# Patient Record
Sex: Female | Born: 1935 | Race: White | Hispanic: No | Marital: Married | Smoking: Never smoker
Health system: Southern US, Community
[De-identification: ages and names within clinical notes are randomized; demographics above are authoritative.]

## PROBLEM LIST (undated history)

## (undated) DIAGNOSIS — C50919 Malignant neoplasm of unspecified site of unspecified female breast: Secondary | ICD-10-CM

## (undated) DIAGNOSIS — C801 Malignant (primary) neoplasm, unspecified: Secondary | ICD-10-CM

## (undated) DIAGNOSIS — E119 Type 2 diabetes mellitus without complications: Secondary | ICD-10-CM

## (undated) HISTORY — PX: BREAST EXCISIONAL BIOPSY: SUR124

## (undated) HISTORY — PX: BREAST BIOPSY: SHX20

---

## 1991-12-03 DIAGNOSIS — C50919 Malignant neoplasm of unspecified site of unspecified female breast: Secondary | ICD-10-CM

## 1991-12-03 HISTORY — DX: Malignant neoplasm of unspecified site of unspecified female breast: C50.919

## 1991-12-03 HISTORY — PX: MASTECTOMY: SHX3

## 2005-05-29 ENCOUNTER — Ambulatory Visit: Payer: Self-pay | Admitting: General Surgery

## 2005-06-18 ENCOUNTER — Ambulatory Visit: Payer: Self-pay | Admitting: Internal Medicine

## 2005-07-02 ENCOUNTER — Ambulatory Visit: Payer: Self-pay | Admitting: Internal Medicine

## 2005-08-02 ENCOUNTER — Ambulatory Visit: Payer: Self-pay | Admitting: Internal Medicine

## 2005-09-01 ENCOUNTER — Ambulatory Visit: Payer: Self-pay | Admitting: Internal Medicine

## 2006-03-10 ENCOUNTER — Ambulatory Visit: Payer: Self-pay | Admitting: Internal Medicine

## 2006-03-18 ENCOUNTER — Ambulatory Visit: Payer: Self-pay | Admitting: Internal Medicine

## 2006-06-02 ENCOUNTER — Ambulatory Visit: Payer: Self-pay | Admitting: General Surgery

## 2006-08-08 ENCOUNTER — Ambulatory Visit: Payer: Self-pay | Admitting: Unknown Physician Specialty

## 2006-10-11 ENCOUNTER — Emergency Department: Payer: Self-pay | Admitting: Emergency Medicine

## 2006-10-11 ENCOUNTER — Other Ambulatory Visit: Payer: Self-pay

## 2007-06-10 ENCOUNTER — Ambulatory Visit: Payer: Self-pay | Admitting: General Surgery

## 2008-05-06 ENCOUNTER — Ambulatory Visit: Payer: Self-pay | Admitting: Internal Medicine

## 2008-06-01 ENCOUNTER — Ambulatory Visit: Payer: Self-pay | Admitting: General Surgery

## 2008-08-02 ENCOUNTER — Other Ambulatory Visit: Payer: Self-pay

## 2008-08-02 ENCOUNTER — Emergency Department: Payer: Self-pay | Admitting: Emergency Medicine

## 2008-08-03 ENCOUNTER — Ambulatory Visit: Payer: Self-pay

## 2009-06-07 ENCOUNTER — Ambulatory Visit: Payer: Self-pay | Admitting: General Surgery

## 2009-06-20 ENCOUNTER — Ambulatory Visit: Payer: Self-pay | Admitting: General Surgery

## 2009-06-22 ENCOUNTER — Ambulatory Visit: Payer: Self-pay | Admitting: General Surgery

## 2010-02-13 ENCOUNTER — Ambulatory Visit: Payer: Self-pay | Admitting: Unknown Physician Specialty

## 2010-04-23 ENCOUNTER — Ambulatory Visit: Payer: Self-pay | Admitting: Unknown Physician Specialty

## 2010-04-26 ENCOUNTER — Ambulatory Visit: Payer: Self-pay | Admitting: Unknown Physician Specialty

## 2010-07-23 ENCOUNTER — Ambulatory Visit: Payer: Self-pay | Admitting: General Surgery

## 2011-09-02 ENCOUNTER — Ambulatory Visit: Payer: Self-pay | Admitting: General Surgery

## 2012-02-11 ENCOUNTER — Emergency Department: Payer: Self-pay | Admitting: Unknown Physician Specialty

## 2012-09-11 ENCOUNTER — Ambulatory Visit: Payer: Self-pay | Admitting: General Surgery

## 2013-06-21 ENCOUNTER — Ambulatory Visit: Payer: Self-pay | Admitting: Physical Medicine and Rehabilitation

## 2013-09-14 ENCOUNTER — Ambulatory Visit: Payer: Self-pay | Admitting: Family Medicine

## 2014-09-15 ENCOUNTER — Ambulatory Visit: Payer: Self-pay | Admitting: Internal Medicine

## 2014-09-22 DIAGNOSIS — Z853 Personal history of malignant neoplasm of breast: Secondary | ICD-10-CM | POA: Insufficient documentation

## 2014-11-14 ENCOUNTER — Emergency Department: Payer: Self-pay | Admitting: Emergency Medicine

## 2015-01-10 ENCOUNTER — Ambulatory Visit: Payer: Self-pay | Admitting: Internal Medicine

## 2015-05-03 ENCOUNTER — Other Ambulatory Visit: Payer: Self-pay | Admitting: Internal Medicine

## 2015-05-03 DIAGNOSIS — R1031 Right lower quadrant pain: Secondary | ICD-10-CM

## 2015-05-09 ENCOUNTER — Ambulatory Visit
Admission: RE | Admit: 2015-05-09 | Discharge: 2015-05-09 | Disposition: A | Discharge status: Home / Self Care | Payer: Medicare Other | Source: Ambulatory Visit | Attending: Internal Medicine | Admitting: Internal Medicine

## 2015-05-09 DIAGNOSIS — R1031 Right lower quadrant pain: Secondary | ICD-10-CM

## 2015-05-09 HISTORY — DX: Type 2 diabetes mellitus without complications: E11.9

## 2015-05-09 HISTORY — DX: Malignant (primary) neoplasm, unspecified: C80.1

## 2015-05-09 MED ORDER — IOHEXOL 300 MG/ML  SOLN
100.0000 mL | Freq: Once | INTRAMUSCULAR | Status: AC | PRN
  Administered 2015-05-09: 100 mL via INTRAVENOUS

## 2016-03-29 ENCOUNTER — Other Ambulatory Visit: Payer: Self-pay | Admitting: Internal Medicine

## 2016-03-29 DIAGNOSIS — Z1231 Encounter for screening mammogram for malignant neoplasm of breast: Secondary | ICD-10-CM

## 2016-04-03 ENCOUNTER — Ambulatory Visit
Admission: RE | Admit: 2016-04-03 | Discharge: 2016-04-03 | Disposition: A | Discharge status: Home / Self Care | Payer: Medicare Other | Source: Ambulatory Visit | Attending: Internal Medicine | Admitting: Internal Medicine

## 2016-04-03 ENCOUNTER — Other Ambulatory Visit: Payer: Self-pay | Admitting: Internal Medicine

## 2016-04-03 DIAGNOSIS — Z1231 Encounter for screening mammogram for malignant neoplasm of breast: Secondary | ICD-10-CM

## 2017-02-21 ENCOUNTER — Other Ambulatory Visit: Payer: Self-pay | Admitting: Internal Medicine

## 2017-02-21 DIAGNOSIS — Z1231 Encounter for screening mammogram for malignant neoplasm of breast: Secondary | ICD-10-CM

## 2017-04-08 ENCOUNTER — Ambulatory Visit
Admission: RE | Admit: 2017-04-08 | Discharge: 2017-04-08 | Disposition: A | Discharge status: Home / Self Care | Payer: Medicare Other | Source: Ambulatory Visit | Attending: Internal Medicine | Admitting: Internal Medicine

## 2017-04-08 DIAGNOSIS — Z1231 Encounter for screening mammogram for malignant neoplasm of breast: Secondary | ICD-10-CM | POA: Diagnosis present

## 2017-04-08 HISTORY — DX: Malignant neoplasm of unspecified site of unspecified female breast: C50.919

## 2018-03-06 ENCOUNTER — Other Ambulatory Visit: Payer: Self-pay | Admitting: Internal Medicine

## 2018-03-06 DIAGNOSIS — Z1231 Encounter for screening mammogram for malignant neoplasm of breast: Secondary | ICD-10-CM

## 2018-04-10 ENCOUNTER — Ambulatory Visit
Admission: RE | Admit: 2018-04-10 | Discharge: 2018-04-10 | Disposition: A | Discharge status: Home / Self Care | Payer: Medicare Other | Source: Ambulatory Visit | Attending: Internal Medicine | Admitting: Internal Medicine

## 2018-04-10 DIAGNOSIS — Z1231 Encounter for screening mammogram for malignant neoplasm of breast: Secondary | ICD-10-CM | POA: Insufficient documentation

## 2019-01-04 ENCOUNTER — Ambulatory Visit
Admission: RE | Admit: 2019-01-04 | Discharge: 2019-01-04 | Disposition: A | Discharge status: Home / Self Care | Payer: Medicare Other | Source: Ambulatory Visit | Attending: Internal Medicine | Admitting: Internal Medicine

## 2019-01-04 ENCOUNTER — Other Ambulatory Visit: Payer: Self-pay | Admitting: Internal Medicine

## 2019-01-04 DIAGNOSIS — N320 Bladder-neck obstruction: Secondary | ICD-10-CM | POA: Diagnosis not present

## 2019-01-04 DIAGNOSIS — N3090 Cystitis, unspecified without hematuria: Secondary | ICD-10-CM | POA: Diagnosis present

## 2019-01-04 DIAGNOSIS — N289 Disorder of kidney and ureter, unspecified: Secondary | ICD-10-CM | POA: Insufficient documentation

## 2019-01-04 DIAGNOSIS — N281 Cyst of kidney, acquired: Secondary | ICD-10-CM | POA: Insufficient documentation

## 2019-01-05 ENCOUNTER — Other Ambulatory Visit: Payer: Self-pay | Admitting: Internal Medicine

## 2019-01-05 DIAGNOSIS — N2889 Other specified disorders of kidney and ureter: Secondary | ICD-10-CM

## 2019-01-14 ENCOUNTER — Ambulatory Visit
Admission: RE | Admit: 2019-01-14 | Discharge: 2019-01-14 | Disposition: A | Discharge status: Home / Self Care | Payer: Medicare Other | Source: Ambulatory Visit | Attending: Internal Medicine | Admitting: Internal Medicine

## 2019-01-14 DIAGNOSIS — N2889 Other specified disorders of kidney and ureter: Secondary | ICD-10-CM | POA: Insufficient documentation

## 2019-01-14 MED ORDER — GADOBUTROL 1 MMOL/ML IV SOLN
7.0000 mL | Freq: Once | INTRAVENOUS | Status: AC | PRN
  Administered 2019-01-14: 7 mL via INTRAVENOUS

## 2019-05-19 ENCOUNTER — Other Ambulatory Visit: Payer: Self-pay | Admitting: Internal Medicine

## 2019-05-19 DIAGNOSIS — Z1231 Encounter for screening mammogram for malignant neoplasm of breast: Secondary | ICD-10-CM

## 2019-08-02 ENCOUNTER — Ambulatory Visit
Admission: RE | Admit: 2019-08-02 | Discharge: 2019-08-02 | Disposition: A | Discharge status: Home / Self Care | Payer: Medicare Other | Source: Ambulatory Visit | Attending: Internal Medicine | Admitting: Internal Medicine

## 2019-08-02 DIAGNOSIS — Z1231 Encounter for screening mammogram for malignant neoplasm of breast: Secondary | ICD-10-CM

## 2019-12-26 DIAGNOSIS — Z20822 Contact with and (suspected) exposure to covid-19: Secondary | ICD-10-CM | POA: Diagnosis not present

## 2020-01-03 DIAGNOSIS — E1169 Type 2 diabetes mellitus with other specified complication: Secondary | ICD-10-CM | POA: Diagnosis not present

## 2020-01-03 DIAGNOSIS — I10 Essential (primary) hypertension: Secondary | ICD-10-CM | POA: Diagnosis not present

## 2020-01-03 DIAGNOSIS — E538 Deficiency of other specified B group vitamins: Secondary | ICD-10-CM | POA: Diagnosis not present

## 2020-01-03 DIAGNOSIS — E782 Mixed hyperlipidemia: Secondary | ICD-10-CM | POA: Diagnosis not present

## 2020-01-05 DIAGNOSIS — E113293 Type 2 diabetes mellitus with mild nonproliferative diabetic retinopathy without macular edema, bilateral: Secondary | ICD-10-CM | POA: Diagnosis not present

## 2020-01-05 DIAGNOSIS — Z794 Long term (current) use of insulin: Secondary | ICD-10-CM | POA: Diagnosis not present

## 2020-01-05 DIAGNOSIS — H353231 Exudative age-related macular degeneration, bilateral, with active choroidal neovascularization: Secondary | ICD-10-CM | POA: Diagnosis not present

## 2020-01-05 DIAGNOSIS — H35373 Puckering of macula, bilateral: Secondary | ICD-10-CM | POA: Diagnosis not present

## 2020-01-05 DIAGNOSIS — H3540 Unspecified peripheral retinal degeneration: Secondary | ICD-10-CM | POA: Diagnosis not present

## 2020-01-13 DIAGNOSIS — H401411 Capsular glaucoma with pseudoexfoliation of lens, right eye, mild stage: Secondary | ICD-10-CM | POA: Diagnosis not present

## 2020-01-13 DIAGNOSIS — H401423 Capsular glaucoma with pseudoexfoliation of lens, left eye, severe stage: Secondary | ICD-10-CM | POA: Diagnosis not present

## 2020-01-29 IMAGING — MG MM DIGITAL SCREENING UNILAT*R* W/ TOMO W/ CAD
4 series · 4 of 12 positions shown · non-contrast
Comparison: Previous exam(s).

CLINICAL DATA: Screening.

EXAM:
DIGITAL SCREENING UNILATERAL RIGHT MAMMOGRAM WITH CAD AND TOMO

[R CC synth-2D]
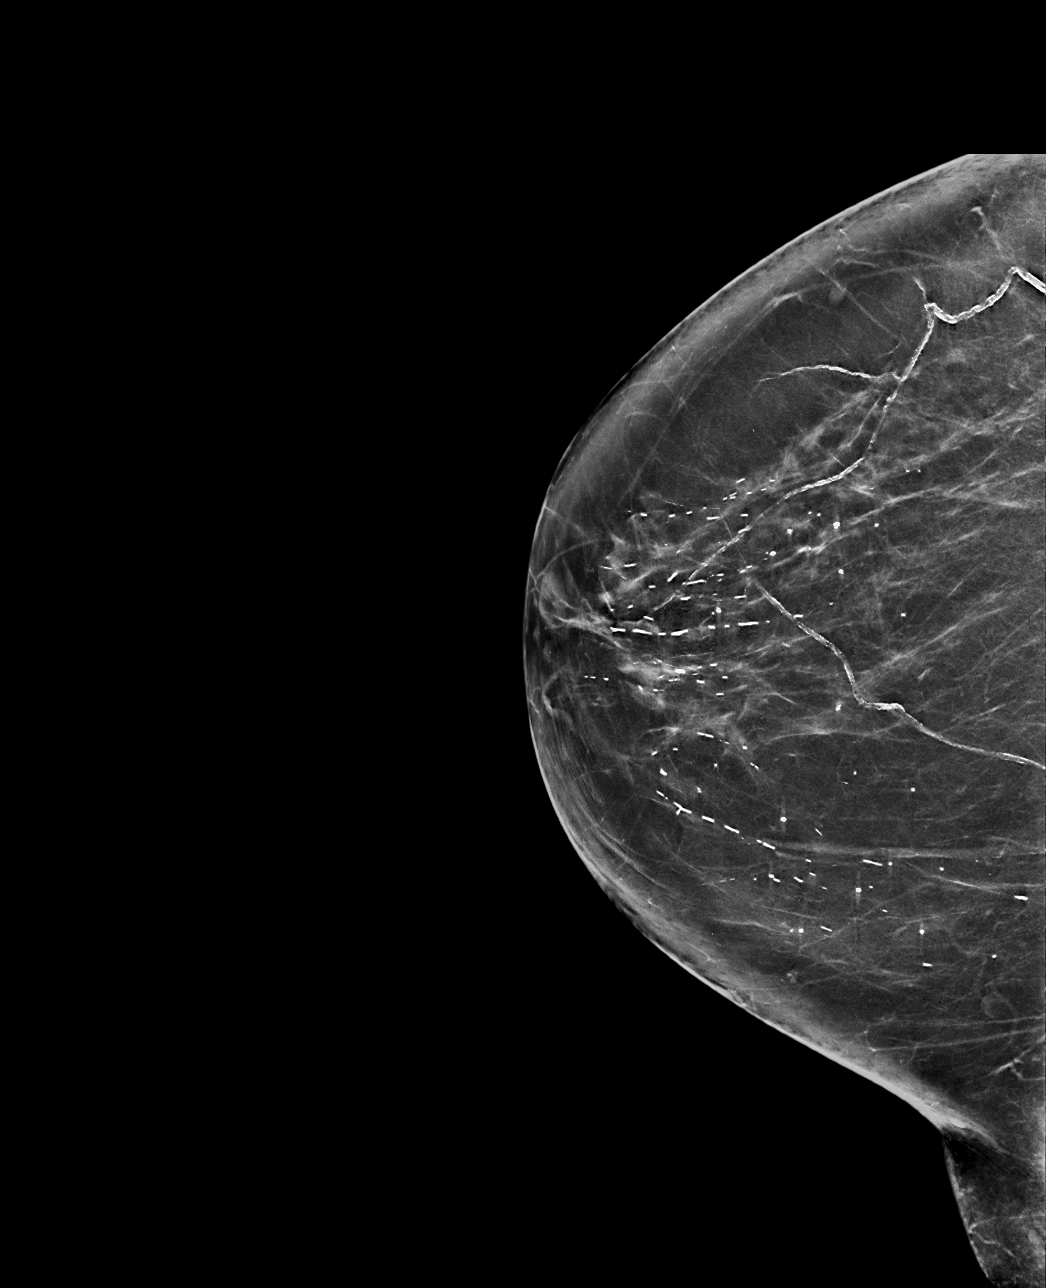

[R MLO synth-2D]
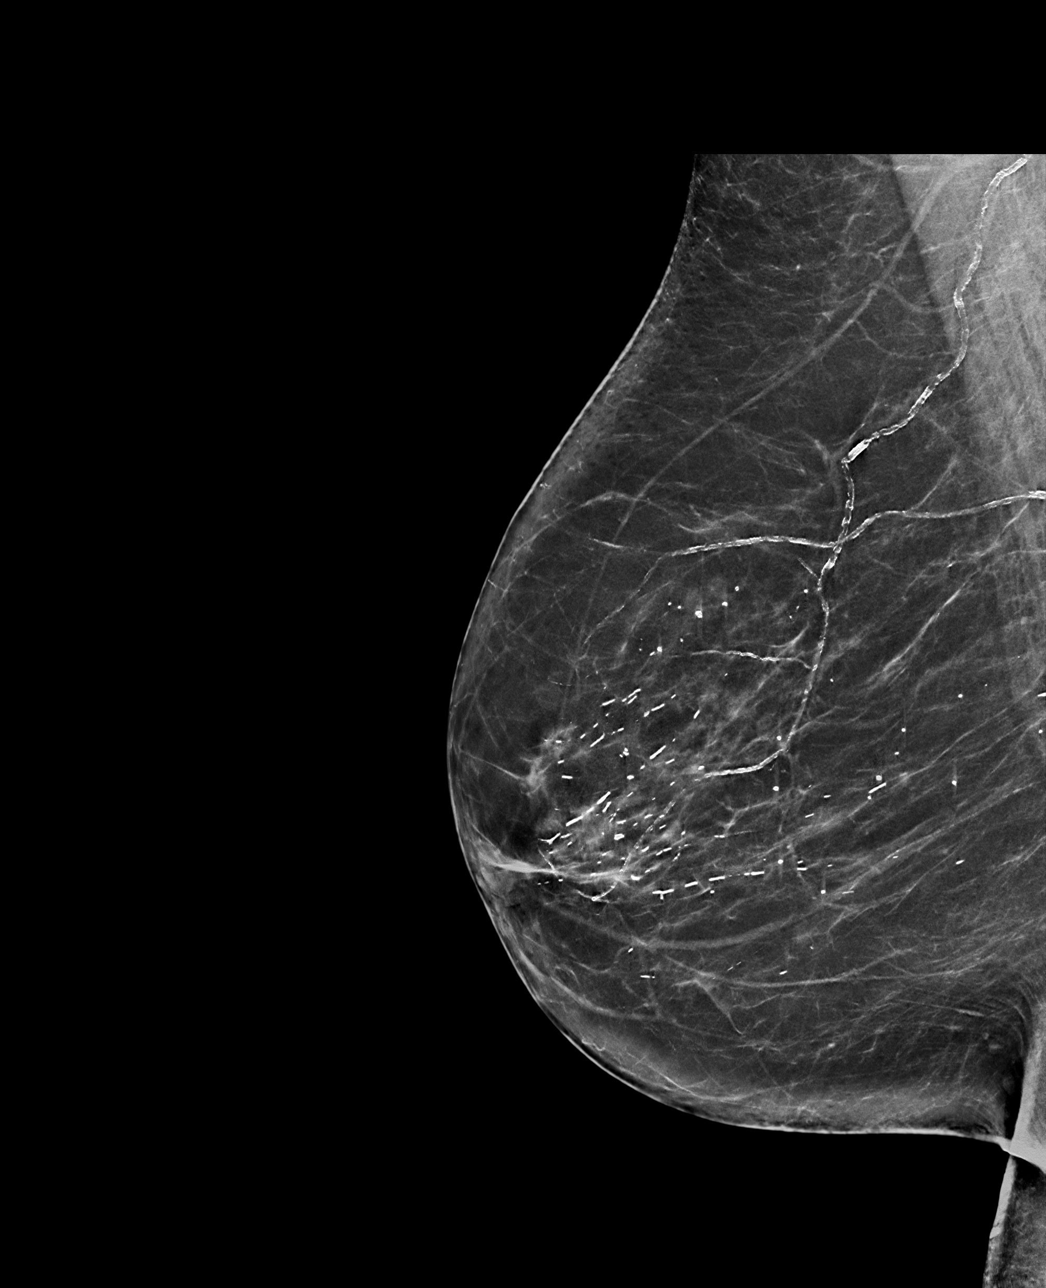

[R MLO tomo · tomo slice 38/75.0]
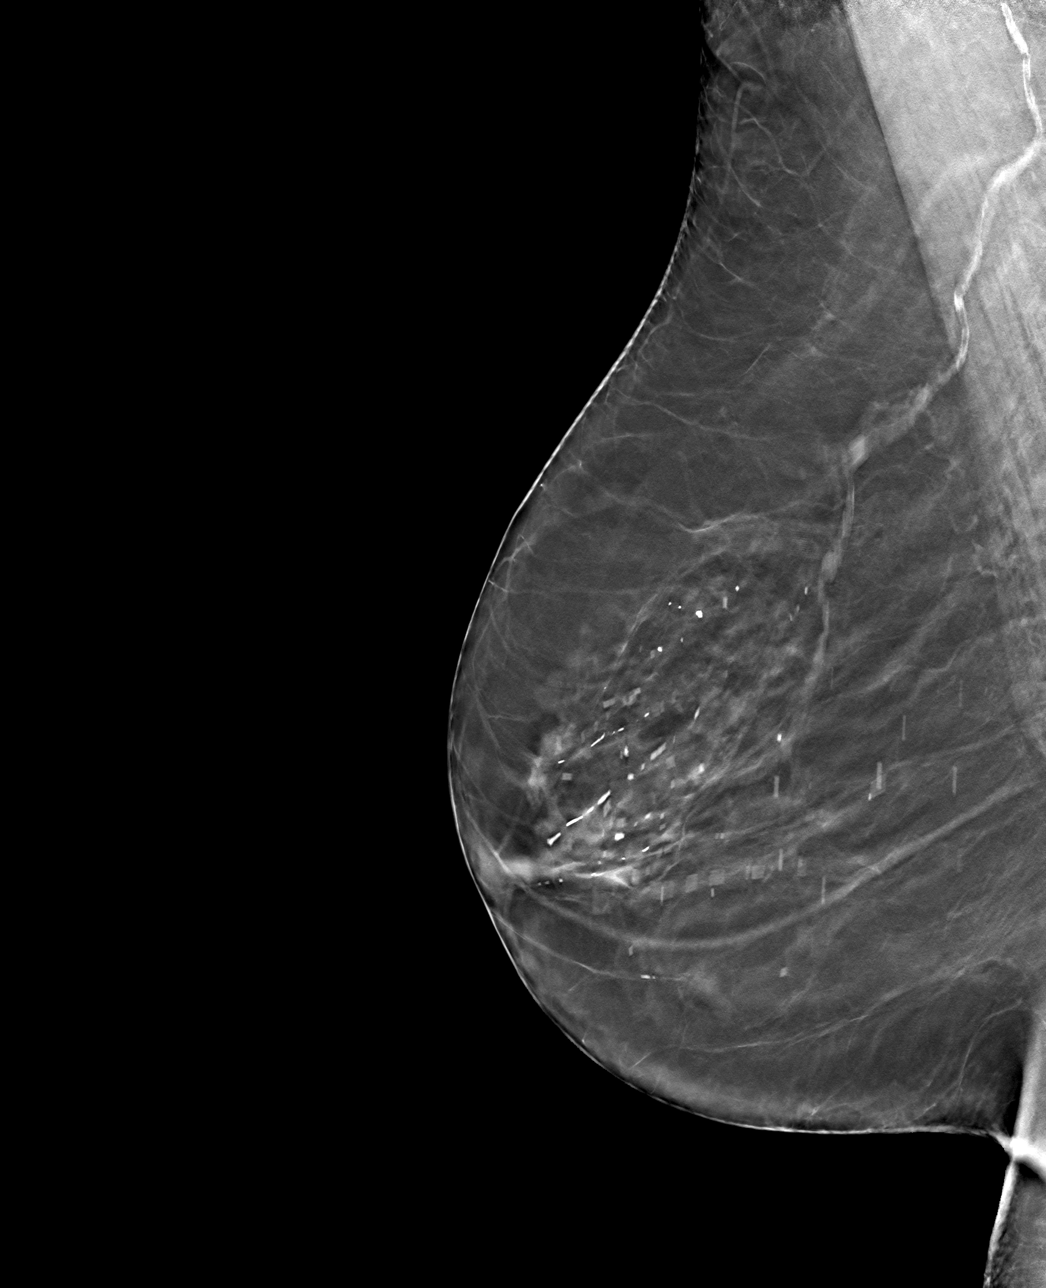

[R CC tomo · tomo slice 38/75.0]
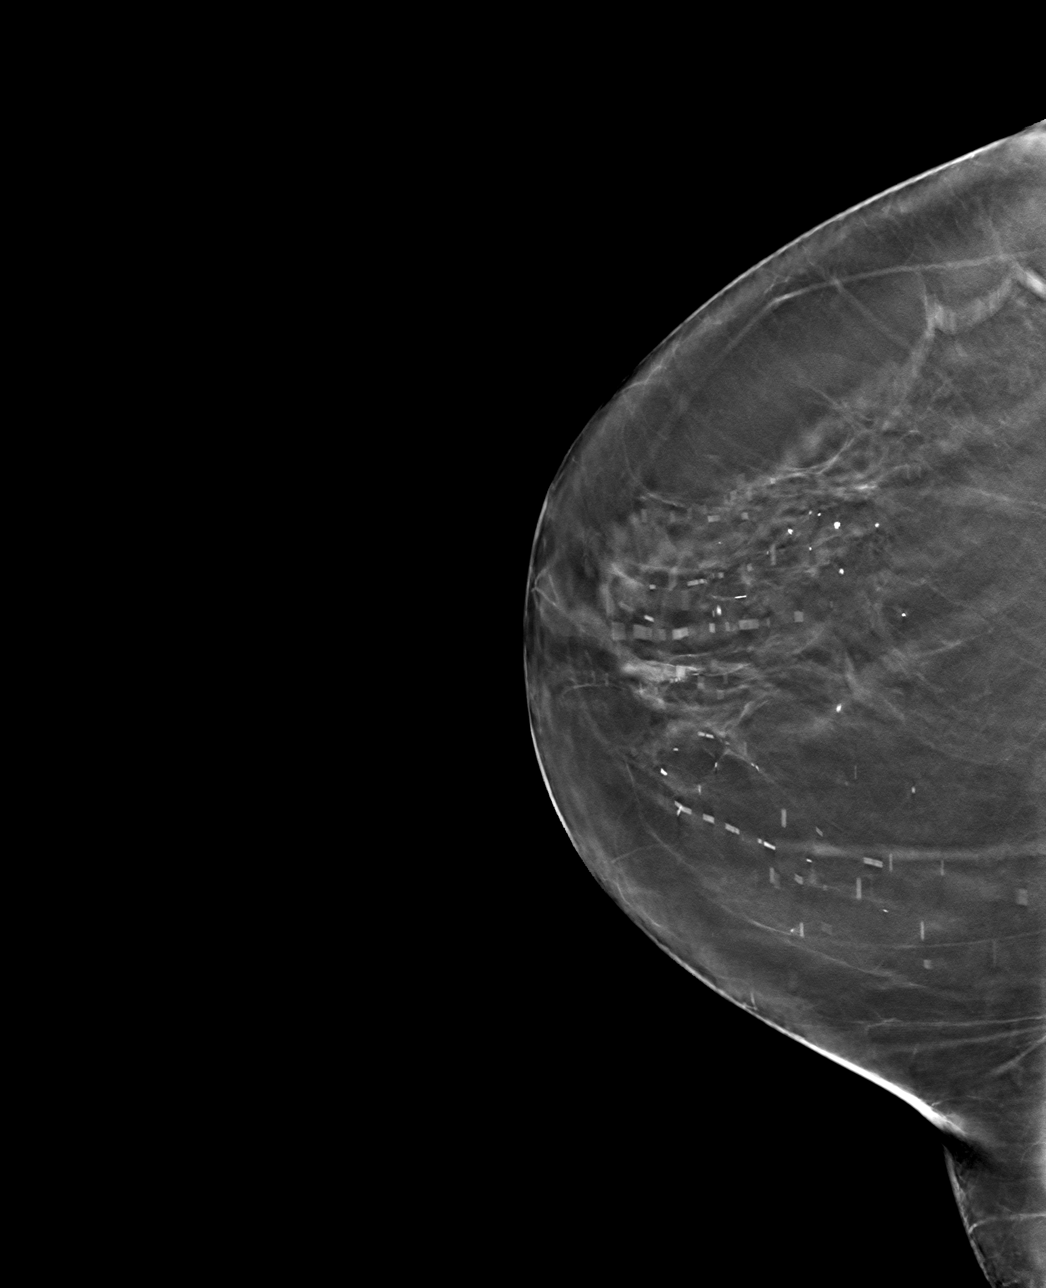

[4 of 12 positions shown; findings below may reference images not displayed]

ACR Breast Density Category b: There are scattered areas of
fibroglandular density.
FINDINGS: The patient has had a left mastectomy. There are no findings
suspicious for malignancy. Images were processed with CAD.
IMPRESSION: No mammographic evidence of malignancy. A result letter of this
screening mammogram will be mailed directly to the patient.

RECOMMENDATION:
Screening mammogram in one year.  (Code:H3-9-9K3)

BI-RADS CATEGORY  1: Negative.

## 2020-06-23 ENCOUNTER — Other Ambulatory Visit: Payer: Self-pay | Admitting: Internal Medicine

## 2020-06-23 DIAGNOSIS — Z1231 Encounter for screening mammogram for malignant neoplasm of breast: Secondary | ICD-10-CM

## 2020-08-04 ENCOUNTER — Other Ambulatory Visit: Payer: Self-pay

## 2020-08-04 ENCOUNTER — Ambulatory Visit
Admission: RE | Admit: 2020-08-04 | Discharge: 2020-08-04 | Disposition: A | Discharge status: Home / Self Care | Payer: Medicare HMO | Source: Ambulatory Visit | Attending: Internal Medicine | Admitting: Internal Medicine

## 2020-08-04 ENCOUNTER — Other Ambulatory Visit: Payer: Self-pay | Admitting: Internal Medicine

## 2020-08-04 DIAGNOSIS — Z1231 Encounter for screening mammogram for malignant neoplasm of breast: Secondary | ICD-10-CM

## 2020-12-01 ENCOUNTER — Emergency Department: Payer: Medicare HMO

## 2020-12-01 ENCOUNTER — Other Ambulatory Visit: Payer: Self-pay

## 2020-12-01 ENCOUNTER — Emergency Department
Admission: EM | Admit: 2020-12-01 | Discharge: 2020-12-01 | Disposition: A | Discharge status: Home / Self Care | Payer: Medicare HMO | Attending: Emergency Medicine | Admitting: Emergency Medicine

## 2020-12-01 ENCOUNTER — Encounter: Payer: Self-pay | Admitting: Emergency Medicine

## 2020-12-01 DIAGNOSIS — R42 Dizziness and giddiness: Secondary | ICD-10-CM | POA: Diagnosis not present

## 2020-12-01 DIAGNOSIS — Z853 Personal history of malignant neoplasm of breast: Secondary | ICD-10-CM | POA: Diagnosis not present

## 2020-12-01 DIAGNOSIS — E119 Type 2 diabetes mellitus without complications: Secondary | ICD-10-CM | POA: Diagnosis not present

## 2020-12-01 DIAGNOSIS — R519 Headache, unspecified: Secondary | ICD-10-CM | POA: Diagnosis not present

## 2020-12-01 DIAGNOSIS — R200 Anesthesia of skin: Secondary | ICD-10-CM | POA: Insufficient documentation

## 2020-12-01 LAB — DIFFERENTIAL
Abs Immature Granulocytes: 0.03 10*3/uL (ref 0.00–0.07)
Basophils Absolute: 0 10*3/uL (ref 0.0–0.1)
Basophils Relative: 1 %
Eosinophils Absolute: 0.3 10*3/uL (ref 0.0–0.5)
Eosinophils Relative: 5 %
Immature Granulocytes: 0 %
Lymphocytes Relative: 19 %
Lymphs Abs: 1.4 10*3/uL (ref 0.7–4.0)
Monocytes Absolute: 0.6 10*3/uL (ref 0.1–1.0)
Monocytes Relative: 9 %
Neutro Abs: 4.9 10*3/uL (ref 1.7–7.7)
Neutrophils Relative %: 66 %

## 2020-12-01 LAB — CBC
HCT: 44 % (ref 36.0–46.0)
Hemoglobin: 14 g/dL (ref 12.0–15.0)
MCH: 30.8 pg (ref 26.0–34.0)
MCHC: 31.8 g/dL (ref 30.0–36.0)
MCV: 96.9 fL (ref 80.0–100.0)
Platelets: 211 10*3/uL (ref 150–400)
RBC: 4.54 MIL/uL (ref 3.87–5.11)
RDW: 12.8 % (ref 11.5–15.5)
WBC: 7.4 10*3/uL (ref 4.0–10.5)
nRBC: 0 % (ref 0.0–0.2)

## 2020-12-01 LAB — APTT: aPTT: 30 seconds (ref 24–36)

## 2020-12-01 LAB — COMPREHENSIVE METABOLIC PANEL
ALT: 10 U/L (ref 0–44)
AST: 15 U/L (ref 15–41)
Albumin: 3.7 g/dL (ref 3.5–5.0)
Alkaline Phosphatase: 80 U/L (ref 38–126)
Anion gap: 12 (ref 5–15)
BUN: 18 mg/dL (ref 8–23)
CO2: 23 mmol/L (ref 22–32)
Calcium: 9.4 mg/dL (ref 8.9–10.3)
Chloride: 103 mmol/L (ref 98–111)
Creatinine, Ser: 0.64 mg/dL (ref 0.44–1.00)
GFR, Estimated: >60 mL/min (ref >60)
Glucose, Bld: 239 mg/dL — ABNORMAL HIGH (ref 70–99)
Potassium: 3.9 mmol/L (ref 3.5–5.1)
Sodium: 138 mmol/L (ref 135–145)
Total Bilirubin: 0.7 mg/dL (ref 0.3–1.2)
Total Protein: 7.1 g/dL (ref 6.5–8.1)

## 2020-12-01 LAB — PROTIME-INR
INR: 1 (ref 0.8–1.2)
Prothrombin Time: 12.3 seconds (ref 11.4–15.2)

## 2020-12-01 MED ORDER — ACETAMINOPHEN 325 MG PO TABS
650.0000 mg | ORAL_TABLET | Freq: Once | ORAL | Status: AC
  Administered 2020-12-01: 650 mg via ORAL
  Filled 2020-12-01: qty 2

## 2020-12-01 MED ORDER — FLUTICASONE PROPIONATE 50 MCG/ACT NA SUSP: 1.0000 | Freq: Every day | NASAL | 0 refills | Status: DC

## 2020-12-01 NOTE — ED Notes (Signed)
Pt ambulatory to restroom and back to bed with no assistance

## 2020-12-01 NOTE — Discharge Instructions (Signed)
Follow-up with your primary care doctor within the next 1 to 2 weeks.  Return to the ER immediately for new, worsening, or recurrent numbness or weakness to the arm or leg, numbness or drooping to her face, difficulty speaking, changes in your vision, difficulty walking or with balance, or any other new or worsening symptoms that concern you.  You may use the Flonase nasal spray over the next week for your congestion and sinus symptoms.

## 2020-12-01 NOTE — ED Triage Notes (Signed)
Pt arrived via POV with reports of numbness to R arm, face, lips and nose that started around 8am, pt states it lasted a couple of hours. On arrival to ED, pt denies any numbness. Pt reports no numbness at this time, no slurred speech, no facial droop.  Pt states same thing happened last week.  Bilateral equal grip strengths.

## 2020-12-01 NOTE — ED Provider Notes (Signed)
[facility] Emergency Department Provider Note ____________________________________________   Event Date/Time   First MD Initiated Contact with Patient 12/01/20 1134     (approximate)  I have reviewed the triage vital signs and the nursing notes.   HISTORY  Chief Complaint Numbness    HPI Susan Wolfe is a 86 y.o. female with PMH as noted below who presents with numbness to the right arm and face, acute onset around 8 AM, and now resolved after a few hours.  The patient states that it started while she was eating.  She initially felt numbness and tingling in the arm, and then started to feel it in her face.  It affected both sides of her face especially the lips.  The patient states that she felt somewhat dizzy and lightheaded at the same time.  Since then she has developed a mild headache.  She denies any other acute symptoms at this time.  She states that a few weeks ago she briefly had an episode of tingling or numbness in the left hand that resolved.  She denies any prior stroke history.  Past Medical History:  Diagnosis Date  . Breast cancer (HCC) 1993  . Cancer (HCC)    Left Breast CA, mastectomy. No other tx's.  . Diabetes mellitus without complication (HCC)    Metformin and Lantus    There are no problems to display for this patient.   Past Surgical History:  Procedure Laterality Date  . BREAST EXCISIONAL BIOPSY Right 1990's  . MASTECTOMY Left 1993    Prior to Admission medications   Medication Sig Start Date End Date Taking? Authorizing Provider  fluticasone (FLONASE) 50 MCG/ACT nasal spray Place 1 spray into both nostrils daily for 7 days. 12/01/20 12/08/20 Yes Dionne BucySiadecki, Hiroto Saltzman, MD    Allergies Penicillins, Ace inhibitors, Amoxicillin, Clarithromycin, Lovastatin, Other, Oxycodone-acetaminophen, Rabeprazole, Rosiglitazone, and Valsartan  Family History  Problem Relation Age of Onset  . Breast cancer Neg Hx     Social  History Social History   Tobacco Use  . Smoking status: Never Smoker  . Smokeless tobacco: Never Used  Vaping Use  . Vaping Use: Never used    Review of Systems  Constitutional: No fever. Eyes: No visual changes. ENT: No neck pain. Cardiovascular: Denies chest pain. Respiratory: Denies shortness of breath. Gastrointestinal: No vomiting or diarrhea.  Genitourinary: Negative for dysuria.  Musculoskeletal: Negative for back pain. Skin: Negative for rash. Neurological: Positive for headaches and resolved numbness or tingling.   ____________________________________________   PHYSICAL EXAM:  VITAL SIGNS: ED Triage Vitals  Enc Vitals Group     BP 12/01/20 1049 (!) 194/87     Pulse Rate 12/01/20 1049 81     Resp 12/01/20 1049 18     Temp 12/01/20 1049 97.9 F (36.6 C)     Temp Source 12/01/20 1049 Oral     SpO2 12/01/20 1049 99 %     Weight 12/01/20 1049 148 lb (67.1 kg)     Height 12/01/20 1049 5' 3.5" (1.613 m)     Head Circumference --      Peak Flow --      Pain Score 12/01/20 1046 0     Pain Loc --      Pain Edu? --      Excl. in GC? --     Constitutional: Alert and oriented. Well appearing and in no acute distress. Eyes: Conjunctivae are normal.  EOMI.  PERRLA. Head: Atraumatic. Nose: No congestion/rhinnorhea. Mouth/Throat: Mucous  membranes are moist.   Neck: Normal range of motion.  Cardiovascular: Normal rate, regular rhythm.  Good peripheral circulation. Respiratory: Normal respiratory effort.  No retractions.  Gastrointestinal: No distention.  Musculoskeletal: No lower extremity edema.  Extremities warm and well perfused.  Neurologic:  Normal speech and language.  5/5 motor strength and intact sensation to bilateral upper and lower extremities.  Normal coordination with no ataxia on finger-to-nose.  No pronator drift.  No facial droop. Skin:  Skin is warm and dry. No rash noted. Psychiatric: Mood and affect are normal. Speech and behavior are  normal.  ____________________________________________   LABS (all labs ordered are listed, but only abnormal results are displayed)  Labs Reviewed  COMPREHENSIVE METABOLIC PANEL - Abnormal; Notable for the following components:      Result Value   Glucose, Bld 239 (*)    All other components within normal limits  PROTIME-INR  APTT  CBC  DIFFERENTIAL  CBG MONITORING, ED   ____________________________________________  EKG  ED ECG REPORT I, Arta Silence, the attending physician, personally viewed and interpreted this ECG.  Date: 12/01/2020 EKG Time: 1110 Rate: 75 Rhythm: normal sinus rhythm QRS Axis: Left axis Intervals: RV conduction delay ST/T Wave abnormalities: Nonspecific T wave abnormalities  Narrative Interpretation: Nonspecific abnormalities with no evidence of acute ischemia; no recent prior EKG available for comparison  ____________________________________________  RADIOLOGY  CT head: No ICH or evidence of acute stroke MR brain: No acute stroke or other intracranial abnormality  ____________________________________________   PROCEDURES  Procedure(s) performed: No  Procedures  Critical Care performed: No ____________________________________________   INITIAL IMPRESSION / ASSESSMENT AND PLAN / ED COURSE  Pertinent labs & imaging results that were available during my care of the patient were reviewed by me and considered in my medical decision making (see chart for details).  86 year old female with PMH as noted above presents with an episode of numbness and paresthesias in the right hand and in her face, although it seems that the symptoms in the face were mainly perioral and across the midline.  The patient reports a mild headache but symptoms have otherwise resolved.  I reviewed the past medical records in [place].  The patient has no recent prior ED visits or admissions.  On exam, she is very well-appearing for her age.  Her vital signs are  normal except for hypertension.  The neurologic exam is normal.  There is no right-sided deficit, facial droop, or any other acute abnormality.  Initial CT and lab work-up obtained from triage are also within normal limits.  Differential includes TIA although overall my suspicion for this is low given that the symptoms cross the midline and were entirely sensory.  It is possible that the patient had a near syncopal event or some hyperventilation and resulting paresthesias.  Differential also includes radiculopathy or other peripheral neurologic cause.  Given the patient's well appearance, reassuring work-up so far, and resolved symptoms, she is overall low risk for stroke and likely will be appropriate for discharge home.  We will obtain an MRI.  If this is negative I anticipate discharge home.  ----------------------------------------- 2:46 PM on 12/01/2020 -----------------------------------------  MRI shows no acute findings.  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  The patient denies any acute changes in her vision from that eye.  I counseled her on the results of the work-up.  The patient feels comfortable and would like to go home.  She has not had any recurrent symptoms in the ED.  She is stable for discharge at this time with outpatient follow-up.  The patient notes some nasal congestion and sinus pressure type symptoms.  I will prescribe Flonase.  Return precautions given, and she expresses understanding. ____________________________________________   FINAL CLINICAL IMPRESSION(S) / ED DIAGNOSES  Final diagnoses:  Numbness      NEW MEDICATIONS STARTED DURING THIS VISIT:  New Prescriptions   FLUTICASONE (FLONASE) 50 MCG/ACT NASAL SPRAY    Place 1 spray into both nostrils daily for 7 days.     Note:  This document was prepared using Dragon  voice recognition software and may include unintentional dictation errors.    Dionne Bucy, MD 12/01/20 1447

## 2021-01-19 ENCOUNTER — Emergency Department: Payer: Medicare HMO

## 2021-01-19 ENCOUNTER — Observation Stay
Admission: EM | Admit: 2021-01-19 | Discharge: 2021-01-20 | Disposition: A | Discharge status: Home / Self Care | Payer: Medicare HMO | Attending: Internal Medicine | Admitting: Internal Medicine

## 2021-01-19 ENCOUNTER — Observation Stay: Payer: Medicare HMO

## 2021-01-19 DIAGNOSIS — Z794 Long term (current) use of insulin: Secondary | ICD-10-CM | POA: Insufficient documentation

## 2021-01-19 DIAGNOSIS — G459 Transient cerebral ischemic attack, unspecified: Principal | ICD-10-CM | POA: Diagnosis present

## 2021-01-19 DIAGNOSIS — M6281 Muscle weakness (generalized): Secondary | ICD-10-CM | POA: Insufficient documentation

## 2021-01-19 DIAGNOSIS — K219 Gastro-esophageal reflux disease without esophagitis: Secondary | ICD-10-CM | POA: Diagnosis not present

## 2021-01-19 DIAGNOSIS — E785 Hyperlipidemia, unspecified: Secondary | ICD-10-CM | POA: Diagnosis present

## 2021-01-19 DIAGNOSIS — R202 Paresthesia of skin: Secondary | ICD-10-CM | POA: Diagnosis present

## 2021-01-19 DIAGNOSIS — Z7984 Long term (current) use of oral hypoglycemic drugs: Secondary | ICD-10-CM | POA: Diagnosis not present

## 2021-01-19 DIAGNOSIS — Z853 Personal history of malignant neoplasm of breast: Secondary | ICD-10-CM | POA: Insufficient documentation

## 2021-01-19 DIAGNOSIS — Z20822 Contact with and (suspected) exposure to covid-19: Secondary | ICD-10-CM | POA: Diagnosis not present

## 2021-01-19 DIAGNOSIS — E119 Type 2 diabetes mellitus without complications: Secondary | ICD-10-CM | POA: Diagnosis not present

## 2021-01-19 DIAGNOSIS — I10 Essential (primary) hypertension: Secondary | ICD-10-CM | POA: Diagnosis not present

## 2021-01-19 DIAGNOSIS — Z79899 Other long term (current) drug therapy: Secondary | ICD-10-CM | POA: Insufficient documentation

## 2021-01-19 HISTORY — DX: Essential (primary) hypertension: I10

## 2021-01-19 LAB — URINALYSIS, ROUTINE W REFLEX MICROSCOPIC
Bilirubin Urine: NEGATIVE
Glucose, UA: 50 mg/dL — AB
Hgb urine dipstick: NEGATIVE
Ketones, ur: NEGATIVE mg/dL
Leukocytes,Ua: NEGATIVE
Nitrite: NEGATIVE
Protein, ur: NEGATIVE mg/dL
Specific Gravity, Urine: 1.012 (ref 1.005–1.030)
pH: 7 (ref 5.0–8.0)

## 2021-01-19 LAB — DIFFERENTIAL
Abs Immature Granulocytes: 0.02 10*3/uL (ref 0.00–0.07)
Basophils Absolute: 0.1 10*3/uL (ref 0.0–0.1)
Basophils Relative: 1 %
Eosinophils Absolute: 0.3 10*3/uL (ref 0.0–0.5)
Eosinophils Relative: 5 %
Immature Granulocytes: 0 %
Lymphocytes Relative: 33 %
Lymphs Abs: 2.1 10*3/uL (ref 0.7–4.0)
Monocytes Absolute: 0.6 10*3/uL (ref 0.1–1.0)
Monocytes Relative: 9 %
Neutro Abs: 3.3 10*3/uL (ref 1.7–7.7)
Neutrophils Relative %: 52 %

## 2021-01-19 LAB — COMPREHENSIVE METABOLIC PANEL
ALT: 11 U/L (ref 0–44)
AST: 15 U/L (ref 15–41)
Albumin: 3.5 g/dL (ref 3.5–5.0)
Alkaline Phosphatase: 95 U/L (ref 38–126)
Anion gap: 7 (ref 5–15)
BUN: 16 mg/dL (ref 8–23)
CO2: 27 mmol/L (ref 22–32)
Calcium: 8.7 mg/dL — ABNORMAL LOW (ref 8.9–10.3)
Chloride: 104 mmol/L (ref 98–111)
Creatinine, Ser: 0.66 mg/dL (ref 0.44–1.00)
GFR, Estimated: >60 mL/min (ref >60)
Glucose, Bld: 246 mg/dL — ABNORMAL HIGH (ref 70–99)
Potassium: 3.9 mmol/L (ref 3.5–5.1)
Sodium: 138 mmol/L (ref 135–145)
Total Bilirubin: 0.5 mg/dL (ref 0.3–1.2)
Total Protein: 6.8 g/dL (ref 6.5–8.1)

## 2021-01-19 LAB — PROTIME-INR
INR: 1 (ref 0.8–1.2)
Prothrombin Time: 12.7 seconds (ref 11.4–15.2)

## 2021-01-19 LAB — CBC
HCT: 34.2 % — ABNORMAL LOW (ref 36.0–46.0)
Hemoglobin: 10.8 g/dL — ABNORMAL LOW (ref 12.0–15.0)
MCH: 30.9 pg (ref 26.0–34.0)
MCHC: 31.6 g/dL (ref 30.0–36.0)
MCV: 98 fL (ref 80.0–100.0)
Platelets: 183 10*3/uL (ref 150–400)
RBC: 3.49 MIL/uL — ABNORMAL LOW (ref 3.87–5.11)
RDW: 13.3 % (ref 11.5–15.5)
WBC: 6.4 10*3/uL (ref 4.0–10.5)
nRBC: 0 % (ref 0.0–0.2)

## 2021-01-19 LAB — APTT: aPTT: 30 seconds (ref 24–36)

## 2021-01-19 MED ORDER — DORZOLAMIDE HCL-TIMOLOL MAL 2-0.5 % OP SOLN
1.0000 [drp] | Freq: Two times a day (BID) | OPHTHALMIC | Status: DC
  Administered 2021-01-20 (×2): 1 [drp] via OPHTHALMIC
  Filled 2021-01-19: qty 10

## 2021-01-19 MED ORDER — INSULIN ASPART 100 UNIT/ML ~~LOC~~ SOLN: 0.0000 [IU] | Freq: Three times a day (TID) | SUBCUTANEOUS | Status: DC

## 2021-01-19 MED ORDER — ASPIRIN 81 MG PO CHEW: 81.0000 mg | CHEWABLE_TABLET | Freq: Once | ORAL | Status: DC

## 2021-01-19 MED ORDER — SIMVASTATIN 20 MG PO TABS
20.0000 mg | ORAL_TABLET | Freq: Every day | ORAL | Status: DC
  Administered 2021-01-20: 20 mg via ORAL
  Filled 2021-01-19: qty 2

## 2021-01-19 MED ORDER — INSULIN GLARGINE 100 UNIT/ML ~~LOC~~ SOLN
5.0000 [IU] | Freq: Every day | SUBCUTANEOUS | Status: DC
  Administered 2021-01-20: 5 [IU] via SUBCUTANEOUS
  Filled 2021-01-19 (×2): qty 0.05

## 2021-01-19 MED ORDER — LABETALOL HCL 5 MG/ML IV SOLN: 10.0000 mg | INTRAVENOUS | Status: DC | PRN

## 2021-01-19 MED ORDER — PANTOPRAZOLE SODIUM 40 MG PO TBEC
40.0000 mg | DELAYED_RELEASE_TABLET | Freq: Every day | ORAL | Status: DC
  Administered 2021-01-20: 40 mg via ORAL
  Filled 2021-01-19: qty 1

## 2021-01-19 MED ORDER — INSULIN GLARGINE 100 UNIT/ML ~~LOC~~ SOLN
10.0000 [IU] | Freq: Every day | SUBCUTANEOUS | Status: DC
  Administered 2021-01-20: 10 [IU] via SUBCUTANEOUS
  Filled 2021-01-19 (×2): qty 0.1

## 2021-01-19 MED ORDER — ENOXAPARIN SODIUM 40 MG/0.4ML ~~LOC~~ SOLN
40.0000 mg | SUBCUTANEOUS | Status: DC
  Administered 2021-01-20: 40 mg via SUBCUTANEOUS
  Filled 2021-01-19: qty 0.4

## 2021-01-19 MED ORDER — IOHEXOL 350 MG/ML SOLN
75.0000 mL | Freq: Once | INTRAVENOUS | Status: AC | PRN
  Administered 2021-01-19: 75 mL via INTRAVENOUS

## 2021-01-19 MED ORDER — STROKE: EARLY STAGES OF RECOVERY BOOK: Freq: Once | Status: DC

## 2021-01-19 MED ORDER — ACETAMINOPHEN 325 MG PO TABS
650.0000 mg | ORAL_TABLET | Freq: Four times a day (QID) | ORAL | Status: DC | PRN
  Administered 2021-01-20: 650 mg via ORAL
  Filled 2021-01-19: qty 2

## 2021-01-19 MED ORDER — ACETAMINOPHEN 650 MG RE SUPP: 650.0000 mg | Freq: Four times a day (QID) | RECTAL | Status: DC | PRN

## 2021-01-19 NOTE — ED Notes (Signed)
Dr Robinson at bedside 

## 2021-01-19 NOTE — H&P (Signed)
History and Physical    PLEASE NOTE THAT DRAGON DICTATION SOFTWARE WAS USED IN THE CONSTRUCTION OF THIS NOTE.   Susan Wolfe JSE:[id number] DOB: 03-Jan-1928 DOA: 01/19/2021  PCP: Rusty Aus, MD Patient coming from: home   I have personally briefly reviewed patient's old medical records in [place]  Chief Complaint: right-sided numbness  HPI: Susan Wolfe is a 87 y.o. female with medical history significant for type 2 diabetes mellitus, GERD, TIA, hypertension, who is admitted to [facility] on 01/19/2021 with suspected TIA after presenting from home to [facility] ED complaining of acute onset of right-sided numbness.   The patient reports after dinner this evening and pushing associated issues, she developed acute onset numbness in her right upper extremity and right lower extremity as well slurred speech.  She reports that the distribution of the numbness associated with the right upper extremity all the dorsal surface of the right hand extending all the way up to the right shoulder, without extension into the right neck.  Additionally, she reports that the numbness associate with the right lower extremity included the dorsal surface of the right foot, with radiation of this numbness of the anterior surface of the right lower extremity terminating around the level of the hip.  She denies any paresthesias of the left side, and denies any associated acute focal weakness.  Not associated with any facial droop, vertigo, acute change in vision, or dysphagia.  The patient was subsequently brought to [facility] ED for further evaluation of these acute symptoms.  She reports her slurred speech and right-sided numbness completely resolved shortly after arrival at [facility] ED, and recurrence of the symptoms or any development of other acute focal neurologic deficits.  She denies any associated chest pain, palpitations, diaphoresis, nausea, vomiting, presyncope, or syncope.  No recent  trauma.  She also denies any recent subjective fever, chills, rigors, or generalized myalgias.  She reports chronic frontal lobe headache, and notes that she was experiencing similar headache earlier today, which was unchanged in location, intensity, and quality relative to that of her chronic headache.  Denies any recent travel or known COVID-19 exposures.  Denies recent dysuria, gross hematuria, or change in urinary urgency/frequency.  The patient acknowledges that she experienced similar symptoms on 12/01/2020, although she reports that the in her right upper extremity at that time was more limited to that the dorsal surface of the right hand as opposed to today's radiation of numbness up to the right shoulder, at which time she was brought to the [facility] ED for further evaluation, at which time she underwent CT of the head showed no evidence of acute intracranial and also underwent MRI of the brain showing no evidence of acute intracranial process, including no evidence of acute ischemic infarct.  In the context of complete resolution of her presenting symptoms, the patient was discharged home from the emergency department on 12/01/2020.  She denies any ensuing acute focal neurologic deficits until development of numbness and slurred speech earlier this evening.  The patient acknowledges a history of hypertension, for which she has prescribed HCTZ for systolic blood pressures greater than 140.  However, she reports that she really needs to take this medication, etc. typical systolic blood pressures are in the high 130s per her frequent blood pressure checks as an outpatient.  She also acknowledges a history of hyperlipidemia for which she reports good compliance with home simvastatin.  She notes type 2 diabetes mellitus, for which she is on  Metformin as well as basal insulin with Lantus 10 units subcu nightly and 24 units subcu every morning in the absence of any short acting insulin she is a lifelong  non-smoker, and denies any known underlying history of atrial fibrillation or obstructive sleep apnea.  Not on any antiplatelet or anticoagulant, including no aspirin.     ED Course:  Vital signs in the ED were notable for the following: Temperature max 98, heart rate 7992; initial blood pressure noted to be 216/85, which subsequently trended down to 193/76; respiratory rate 17-21; oxygen saturation 97 to 99% on room air.  Labs were notable for the following: CMP notable for the following: Sodium 138, carbonate 27, creatinine 0.66, glucose 246.  CBC notable for white blood cell count 6400.  Screening nasopharyngeal COVID-19 PCR performed in the emergency department this evening, with result currently pending.  EKG showed sinus arrhythmia, heart rate 77, right bundle branch block, nonspecific T wave inversion in leads III and V1, which were unchanged relative to most recent prior EKG performed on 12/01/2020, and showed no evidence of ST changes, including no evidence of ST elevation.  Noncontrast CT of the head showed no evidence of acute intracranial process, including no evidence of acute ischemic infarct.  CTA of the head and neck showed no evidence of medium or large vessel stenosis, thrombus, dissection, or aneurysm.  Subsequently, the patient was admitted for for overnight observation for further evaluation and management of her suspected presenting TIA, including additional modifiable acute ischemic CVA risk factors, as further detailed below.    Review of Systems: As per HPI otherwise 10 point review of systems negative.   Past Medical History:  Diagnosis Date  . Breast cancer ([place]) 1993  . Cancer (HCC)    Left Breast CA, mastectomy. No other tx's.  . Diabetes mellitus without complication ([place])    Metformin and Lantus    Past Surgical History:  Procedure Laterality Date  . BREAST EXCISIONAL BIOPSY Right 1990's  . MASTECTOMY Left 1993    Social History:  reports that she has  never smoked. She has never used smokeless tobacco. No history on file for alcohol use and drug use.   Allergies  Allergen Reactions  . Penicillins Hives  . Ace Inhibitors     Other reaction(s): Cough  . Amoxicillin     Other reaction(s): Vomiting  . Clarithromycin Hives  . Lovastatin     Other reaction(s): Other (See Comments) Fatigue  . Other Hives    Jergen's  . Oxycodone-Acetaminophen Nausea Only    Other reaction(s): Vomiting  . Rabeprazole Diarrhea  . Rosiglitazone Diarrhea  . Valsartan     Other reaction(s): Angioedema    Family History  Problem Relation Age of Onset  . Breast cancer Neg Hx      Prior to Admission medications   Medication Sig Start Date End Date Taking? Authorizing Provider  cyanocobalamin 1000 MCG tablet Take 1,000 mcg by mouth daily. 10/31/20  Yes [provider]  dorzolamide-timolol (COSOPT) 22.3-6.8 MG/ML ophthalmic solution Place 1 drop into the right eye 2 (two) times daily. 05/22/20 05/22/21 Yes [provider]  hydrochlorothiazide (HYDRODIURIL) 25 MG tablet Take 25 mg by mouth daily as needed. 10/31/20  Yes [provider]  Insulin Glargine (BASAGLAR KWIKPEN) 100 UNIT/ML Inject 10-24 Units into the skin 2 (two) times daily. 10/31/20  Yes [provider]  metFORMIN (GLUCOPHAGE) 500 MG tablet Take 500 mg by mouth 2 (two) times daily. 10/23/20  Yes [provider]  omeprazole (PRILOSEC) 40 MG capsule Take 40 mg by mouth daily. 01/09/21  Yes [provider]  simvastatin (ZOCOR) 20 MG tablet Take 20 mg by mouth at bedtime. 01/09/21  Yes [provider]  fluticasone (FLONASE) 50 MCG/ACT nasal spray Place 1 spray into both nostrils daily for 7 days. 12/01/20 12/08/20  Arta Silence, MD  hydrOXYzine (ATARAX/VISTARIL) 25 MG tablet Take 25 mg by mouth 2 (two) times daily. Patient not taking: No sig reported 11/01/20   [provider]     Objective    Physical Exam: Vitals:    01/19/21 2010 01/19/21 2012 01/19/21 2025 01/19/21 2107  BP: (!) 216/85  (!) 198/89 (!) 199/75  Pulse: 98  92 83  Resp: 16  19 17   Temp: 98 F (36.7 C)     TempSrc: Oral     SpO2: 99%  98% 99%  Weight:  70.3 kg    Height:  5\' 5"  (1.651 m)      General: appears to be stated age; alert, oriented Skin: warm, dry, no rash Head:  AT/[place] Mouth:  Oral mucosa membranes appear moist, normal dentition Neck: supple; trachea midline Heart:  RRR; did not appreciate any M/R/G Lungs: CTAB, did not appreciate any wheezes, rales, or rhonchi Abdomen: + BS; soft, ND, NT Vascular: 2+ pedal pulses b/l; 2+ radial pulses b/l Extremities: no peripheral edema, no muscle wasting Neuro: 5/5 strength of the proximal and distal flexors and extensors of the upper and lower extremities bilaterally; sensation intact in upper and lower extremities b/l; cranial nerves II through XII grossly intact; no pronator drift; no evidence suggestive of slurred speech, dysarthria, or facial droop; Normal muscle tone. No tremors.    Labs on Admission: I have personally reviewed following labs and imaging studies  CBC: Recent Labs  Lab 01/19/21 2010  WBC 6.4  NEUTROABS 3.3  HGB 10.8*  HCT 34.2*  MCV 98.0  PLT 244   Basic Metabolic Panel: Recent Labs  Lab 01/19/21 2010  NA 138  K 3.9  CL 104  CO2 27  GLUCOSE 246*  BUN 16  CREATININE 0.66  CALCIUM 8.7*   GFR: Estimated Creatinine Clearance: 45 mL/min (by C-G formula based on SCr of 0.66 mg/dL). Liver Function Tests: Recent Labs  Lab 01/19/21 2010  AST 15  ALT 11  ALKPHOS 95  BILITOT 0.5  PROT 6.8  ALBUMIN 3.5   No results for input(s): LIPASE, AMYLASE in the last 168 hours. No results for input(s): AMMONIA in the last 168 hours. Coagulation Profile: Recent Labs  Lab 01/19/21 2010  INR 1.0   Cardiac Enzymes: No results for input(s): CKTOTAL, CKMB, CKMBINDEX, TROPONINI in the last 168 hours. BNP (last 3 results) No results for input(s):  PROBNP in the last 8760 hours. HbA1C: No results for input(s): HGBA1C in the last 72 hours. CBG: No results for input(s): GLUCAP in the last 168 hours. Lipid Profile: No results for input(s): CHOL, HDL, LDLCALC, TRIG, CHOLHDL, LDLDIRECT in the last 72 hours. Thyroid Function Tests: No results for input(s): TSH, T4TOTAL, FREET4, T3FREE, THYROIDAB in the last 72 hours. Anemia Panel: No results for input(s): VITAMINB12, FOLATE, FERRITIN, TIBC, IRON, RETICCTPCT in the last 72 hours. Urine analysis:    Component Value Date/Time   COLORURINE STRAW (A) 01/19/2021 2010   APPEARANCEUR CLEAR (A) 01/19/2021 2010   LABSPEC 1.012 01/19/2021 2010   PHURINE 7.0 01/19/2021 2010   GLUCOSEU 50 (A) 01/19/2021 2010   HGBUR NEGATIVE 01/19/2021 2010   [place] NEGATIVE 01/19/2021  2010   [place] 01/19/2021 2010   PROTEINUR NEGATIVE 01/19/2021 2010   NITRITE NEGATIVE 01/19/2021 2010   LEUKOCYTESUR NEGATIVE 01/19/2021 2010    Radiological Exams on Admission: CT HEAD WO CONTRAST  Result Date: 01/19/2021 CLINICAL DATA:  87 year old female with transient ischemic attack. EXAM: CT HEAD WITHOUT CONTRAST TECHNIQUE: Contiguous axial images were obtained from the base of the skull through the vertex without intravenous contrast. COMPARISON:  Head CT dated 12/01/2020. FINDINGS: Brain: Mild age-related atrophy and chronic microvascular ischemic changes. There is no acute intracranial hemorrhage. No mass effect or midline shift. No extra-axial fluid collection. Vascular: No hyperdense vessel or unexpected calcification. Skull: Normal. Negative for fracture or focal lesion. Sinuses/Orbits: There is mild mucoperiosteal thickening of paranasal sinuses. There is complete opacification of the right sphenoid sinus. No air-fluid level. The left mastoid air cells are clear. Right mastoid effusions. Other: None IMPRESSION: 1. No acute intracranial pathology. 2. Mild age-related atrophy and chronic microvascular  ischemic changes. 3. Right mastoid effusions. Electronically Signed   By: Anner Crete M.D.   On: 01/19/2021 20:59     EKG: Independently reviewed, with result as described above.    Assessment/Plan   Susan Wolfe is a 87 y.o. female with medical history significant for type 2 diabetes mellitus, GERD, TIA, hypertension, who is admitted to [facility] on 01/19/2021 with suspected TIA after presenting from home to [facility] ED complaining of acute onset of right-sided numbness.    Principal Problem:   TIA (transient ischemic attack) Active Problems:   HTN (hypertension)   Diabetes mellitus without complication (HCC)   GERD (gastroesophageal reflux disease)   HLD (hyperlipidemia)    #) TIA: suspected dx on the basis of acute onset of numbness of the right upper and lower extremity as well as concurrent onset of slurred speech at approximately 1800 on 01/19/2021, with CT head showed no evidence of acute intracranial process, while CTA head and neck showed no evidence of large/medium vessel stenosis, dissection, aneurysm, or thrombus.  Patient's symptoms completely resolved spontaneously shortly after arrival at [facility], without any subsequent recurrence or development of additional acute focal neurologic deficits.  We will pursue MRI of the brain to further evaluate for acute ischemic stroke.  Additionally, will further assess if I will acute ischemic CVA risk factors via assessment hemoglobin A1c and lipid panel.  Additionally, as the patient did not undergo TTE with bubble study at the time her TIA presentation in December 2021, will also pursue TTE with bubble study during this hospitalization for evaluation of intracardiac thrombus, septal aneurysm, or septal wall defect.  Close overnight monitoring on telemetry to evaluate for the presence of previously undiagnosed atrial fibrillation.  The patient possesses multiple modifiable CV risk factors, including history of type 2  diabetes mellitus, hypertension, hyperlipidemia any known history of atrial fibrillation or obstructive sleep apnea.  Additionally, she reports that she is a lifelong non-smoker.  EKG in the ED today showed sinus rhythm without evidence of atrial fibrillation.  We will also consult supportive services in the form of PTs OT/OT consultation is to occur in the morning.   No indication for administration of TPA given that the patient's acute focal neurologic deficits spontaneously resolved in entirety, without any subsequent recurrence. Current outpatient antiplatelet/anticoagulant regimen: None, although consideration could be given to initiation of a daily baby aspirin given multiple suspected TIA presentations over the last few months. Current outpatient anti-lipid regimen: Simvastatin.  In the context of recent TIA presentations, could  consider escalation of statin regimen to high intensity statin.  Pending resolved MRI brain, will observe this hypertension for 24 hours following the onset of acute focal neurologic deficits starting 11/18/2021 with associated parameters as further quantified below.  There is some consideration given to initiation of a scheduled antihypertensive medication as an outpatient given systolic blood pressure post TIA presentations noted to be greater than 200, with follow-up logic deficits may be on the basis of hypertensive crisis as opposed to a true TIA.  However, discussions with the patient this evening, she reports that her typical systolic blood pressures run in the high 130s, which appears to be right at goal per [facility] hypertensive guidelines in this patient greater than comorbidity of diabetes.  Consequently, will refrain from modification of her hypertensive regimen, which is currently limited to as needed HCTZ taken for systolic blood pressure greater than 140 mmHg.     Plan: Nursing bedside swallow evaluation x 1 now, and will not initiate oral medications or diet until the  patient has passed this. Head of the bed at 30 degrees. Neuro checks per protocol. VS per protocol. Will allow for permissive hypertension for 24 hours following onset of acute focal neurologic deficits, during which will hold home antihypertensive medications, with prn IV labetalol ordered for systolic blood pressure greater than 132 mmHg or diastolic blood pressure greater than 110 mmHg until 1800 on 01/20/2021. Monitor on telemetry, including monitoring for atrial fibrillation as modifiable risk factor for acute ischemic CVA.  MRI brain.  TTE with bubble study has been ordered to occur in the morning.  Check lipid panel and hemoglobin A1c.  PT/OT/ST consults been ordered to occur in the morning.  Aspirin x1 dose now.  Continue home statin, with next dose to occur now.     #) Type 2 diabetes mellitus: On Metformin as well as basal insulin in the form of Lantus 24 units subcu every morning as well as 10 units subcu nightly in the absence of any associated short acting insulin.  Presenting blood sugar per presenting CMP noted to be 246.  Will check hemoglobin A1c evaluation for modifiable CVA risk factors.  We will also initiate twice daily basal insulin, although at a lower dose than that which the patient takes as an outpatient in order to reduce risk for development of hypoglycemia while in the hospital.  Plan: Check hemoglobin A1c.  Lantus 5 units subcu nightly and 10 units subcu every morning.  Accu-Cheks before every meal and at bedtime with associated low-dose sliding scale insulin.  Hold Metformin while in the hospital.     #) Essential hypertension: Not on any scheduled antihypertensive medications at home [facility]).  HCTZ for suppression.  140.  In the context of patient's report of typical systolic blood pressures running in the high 130s, this regimen appears appropriate given that she appears to typically be had goal blood pressure per antihypertensive guidelines outlined in [place] in the  context of this patient greater than 31 years old with a comorbidity of diabetes.  Presenting systolic blood pressure noted to be slightly greater than 200, although suspect acute elevation TIA, as further described above.   Plan: Hold as needed HCTZ during the hospital.  Observation of permissive hypertension, as outlined above.  Close monitoring of ensuing blood pressure via routine vital signs, as further outlined above.      #) GERD: On omeprazole as an outpatient.  Plan: Continue home PPI.    DVT prophylaxis: Lovenox 40 mg subcu  daily Code Status: Per my discussions with the patient's evening, she wishes to be DNR/DNI. Disposition Plan: Per Rounding Team Consults called: none  Admission status: Observation; med telemetry.    Of note, this patient was added by me to the following Admit List/Treatment Team: armcadmits.      PLEASE NOTE THAT DRAGON DICTATION SOFTWARE WAS USED IN THE CONSTRUCTION OF THIS NOTE.   [facility] Pager [phone] From 12PM - 12AM  Otherwise, please contact night-coverage  [email or web address] Password [facility]   01/19/2021, 9:43 PM

## 2021-01-19 NOTE — ED Notes (Signed)
MRI completing questionnaire at this time.

## 2021-01-19 NOTE — ED Provider Notes (Signed)
[facility] Emergency Department Provider Note    Event Date/Time   First MD Initiated Contact with Patient 01/19/21 2012     (approximate)  I have reviewed the triage vital signs and the nursing notes.   HISTORY  Chief Complaint Numbness    HPI Susan Wolfe is a 87 y.o. female the ER for evaluation of tingling in her right hand that started progressing up into her right arm.  She felt a little bit lightheaded and dizzy during this episode.  Reportedly may have had some slurred speech but that resolved after a few moments.  She did take aspirin.  Had a similar presentation in December and she feels that this is similar.  Noted to be hypertensive but states she is not on any blood pressure medications.  States that his symptoms have significantly improved.  Denies any chest pain or pressure.    Past Medical History:  Diagnosis Date  . Breast cancer ([place]) 1993  . Cancer (HCC)    Left Breast CA, mastectomy. No other tx's.  . Diabetes mellitus without complication (HCC)    Metformin and Lantus   Family History  Problem Relation Age of Onset  . Breast cancer Neg Hx    Past Surgical History:  Procedure Laterality Date  . BREAST EXCISIONAL BIOPSY Right 1990's  . MASTECTOMY Left 1993   There are no problems to display for this patient.     Prior to Admission medications   Medication Sig Start Date End Date Taking? Authorizing Provider  cyanocobalamin 1000 MCG tablet Take 1,000 mcg by mouth daily. 10/31/20  Yes [provider]  dorzolamide-timolol (COSOPT) 22.3-6.8 MG/ML ophthalmic solution Place 1 drop into the right eye 2 (two) times daily. 05/22/20 05/22/21 Yes [provider]  hydrochlorothiazide (HYDRODIURIL) 25 MG tablet Take 25 mg by mouth daily as needed. 10/31/20  Yes [provider]  Insulin Glargine (BASAGLAR KWIKPEN) 100 UNIT/ML Inject 10-24 Units into the skin 2 (two) times daily. 10/31/20  Yes [provider]  metFORMIN (GLUCOPHAGE) 500 MG tablet Take 500 mg by mouth 2 (two) times daily. 10/23/20  Yes [provider]  omeprazole (PRILOSEC) 40 MG capsule Take 40 mg by mouth daily. 01/09/21  Yes [provider]  simvastatin (ZOCOR) 20 MG tablet Take 20 mg by mouth at bedtime. 01/09/21  Yes [provider]  fluticasone (FLONASE) 50 MCG/ACT nasal spray Place 1 spray into both nostrils daily for 7 days. 12/01/20 12/08/20  Arta Silence, MD  hydrOXYzine (ATARAX/VISTARIL) 25 MG tablet Take 25 mg by mouth 2 (two) times daily. Patient not taking: No sig reported 11/01/20   [provider]    Allergies Penicillins, Ace inhibitors, Amoxicillin, Clarithromycin, Lovastatin, Other, Oxycodone-acetaminophen, Rabeprazole, Rosiglitazone, and Valsartan    Social History Social History   Tobacco Use  . Smoking status: Never Smoker  . Smokeless tobacco: Never Used  Vaping Use  . Vaping Use: Never used    Review of Systems Patient denies headaches, rhinorrhea, blurry vision, numbness, shortness of breath, chest pain, edema, cough, abdominal pain, nausea, vomiting, diarrhea, dysuria, fevers, rashes or hallucinations unless otherwise stated above in HPI. ____________________________________________   PHYSICAL EXAM:  VITAL SIGNS: Vitals:   01/19/21 2025 01/19/21 2107  BP: (!) 198/89 (!) 199/75  Pulse: 92 83  Resp: 19 17  Temp:    SpO2: 98% 99%    Constitutional: Alert and oriented.  Eyes: Conjunctivae are normal.  Head: Atraumatic. Nose: No congestion/rhinnorhea. Mouth/Throat: Mucous membranes are  moist.   Neck: No stridor. Painless ROM.  Cardiovascular: Normal rate, regular rhythm. Grossly normal heart sounds.  Good peripheral circulation. Respiratory: Normal respiratory effort.  No retractions. Lungs CTAB. Gastrointestinal: Soft and nontender. No distention. No abdominal bruits. No CVA tenderness. Genitourinary:  Musculoskeletal: No lower  extremity tenderness nor edema.  No joint effusions. Neurologic: CN- intact.  No facial droop, Normal FNF.  Normal heel to shin.  Sensation intact bilaterally. Normal speech and language. No gross focal neurologic deficits are appreciated. No gait instability. Skin:  Skin is warm, dry and intact. No rash noted. Psychiatric: Mood and affect are normal. Speech and behavior are normal.  ____________________________________________   LABS (all labs ordered are listed, but only abnormal results are displayed)  Results for orders placed or performed during the hospital encounter of 01/19/21 (from the past 24 hour(s))  Protime-INR     Status: None   Collection Time: 01/19/21  8:10 PM  Result Value Ref Range   Prothrombin Time 12.7 11.4 - 15.2 seconds   INR 1.0 0.8 - 1.2  APTT     Status: None   Collection Time: 01/19/21  8:10 PM  Result Value Ref Range   aPTT 30 24 - 36 seconds  CBC     Status: Abnormal   Collection Time: 01/19/21  8:10 PM  Result Value Ref Range   WBC 6.4 4.0 - 10.5 K/uL   RBC 3.49 (L) 3.87 - 5.11 MIL/uL   Hemoglobin 10.8 (L) 12.0 - 15.0 g/dL   HCT 34.2 (L) 36.0 - 46.0 %   MCV 98.0 80.0 - 100.0 fL   MCH 30.9 26.0 - 34.0 pg   MCHC 31.6 30.0 - 36.0 g/dL   RDW 13.3 11.5 - 15.5 %   Platelets 183 150 - 400 K/uL   nRBC 0.0 0.0 - 0.2 %  Differential     Status: None   Collection Time: 01/19/21  8:10 PM  Result Value Ref Range   Neutrophils Relative % 52 %   Neutro Abs 3.3 1.7 - 7.7 K/uL   Lymphocytes Relative 33 %   Lymphs Abs 2.1 0.7 - 4.0 K/uL   Monocytes Relative 9 %   Monocytes Absolute 0.6 0.1 - 1.0 K/uL   Eosinophils Relative 5 %   Eosinophils Absolute 0.3 0.0 - 0.5 K/uL   Basophils Relative 1 %   Basophils Absolute 0.1 0.0 - 0.1 K/uL   Immature Granulocytes 0 %   Abs Immature Granulocytes 0.02 0.00 - 0.07 K/uL  Comprehensive metabolic panel     Status: Abnormal   Collection Time: 01/19/21  8:10 PM  Result Value Ref Range   Sodium 138 135 - 145 mmol/L    Potassium 3.9 3.5 - 5.1 mmol/L   Chloride 104 98 - 111 mmol/L   CO2 27 22 - 32 mmol/L   Glucose, Bld 246 (H) 70 - 99 mg/dL   BUN 16 8 - 23 mg/dL   Creatinine, Ser 0.66 0.44 - 1.00 mg/dL   Calcium 8.7 (L) 8.9 - 10.3 mg/dL   Total Protein 6.8 6.5 - 8.1 g/dL   Albumin 3.5 3.5 - 5.0 g/dL   AST 15 15 - 41 U/L   ALT 11 0 - 44 U/L   Alkaline Phosphatase 95 38 - 126 U/L   Total Bilirubin 0.5 0.3 - 1.2 mg/dL   GFR, Estimated >60 >60 mL/min   Anion gap 7 5 - 15  Urinalysis, Routine w reflex microscopic Urine, Clean Catch     Status:  Abnormal   Collection Time: 01/19/21  8:10 PM  Result Value Ref Range   Color, Urine STRAW (A) YELLOW   APPearance CLEAR (A) CLEAR   Specific Gravity, Urine 1.012 1.005 - 1.030   pH 7.0 5.0 - 8.0   Glucose, UA 50 (A) NEGATIVE mg/dL   Hgb urine dipstick NEGATIVE NEGATIVE   Bilirubin Urine NEGATIVE NEGATIVE   Ketones, ur NEGATIVE NEGATIVE mg/dL   Protein, ur NEGATIVE NEGATIVE mg/dL   Nitrite NEGATIVE NEGATIVE   Leukocytes,Ua NEGATIVE NEGATIVE   ____________________________________________  EKG My review and personal interpretation at Time: 20:11   Indication: tingling  Rate: 75  Rhythm: sinus Axis: left Other: nonspecific st abn, no stemi ____________________________________________  RADIOLOGY  I personally reviewed all radiographic images ordered to evaluate for the above acute complaints and reviewed radiology reports and findings.  These findings were personally discussed with the patient.  Please see medical record for radiology report.  ____________________________________________   PROCEDURES  Procedure(s) performed:  Procedures    Critical Care performed: no ____________________________________________   INITIAL IMPRESSION / ASSESSMENT AND PLAN / ED COURSE  Pertinent labs & imaging results that were available during my care of the patient were reviewed by me and considered in my medical decision making (see chart for details).   DDX:  cva, tia, hypoglycemia, dehydration, electrolyte abnormality, dissection, sepsis   Susan Wolfe is a 87 y.o. who presents to the ED with presentation as described above. Patient nontoxic-appearing. No focal neuro deficits. Symptoms and presentation consistent with TIA. Noted to be significantly hypertensive. CT imaging does not show any evidence of hemorrhage. Blood work otherwise reassuring. Does not appear to have had any recent vascular work-up. She is outside of the window for code stroke or TPA but based on her presentation and TIA symptoms possible hypertensive urgency. She received aspirin. Will order CTA. Will consult hospitalist for admission and neuro consultation.     The patient was evaluated in Emergency Department today for the symptoms described in the history of present illness. He/she was evaluated in the context of the global COVID-19 pandemic, which necessitated consideration that the patient might be at risk for infection with the SARS-CoV-2 virus that causes COVID-19. Institutional protocols and algorithms that pertain to the evaluation of patients at risk for COVID-19 are in a state of rapid change based on information released by regulatory bodies including the CDC and federal and state organizations. These policies and algorithms were followed during the patient's care in the ED.  As part of my medical decision making, I reviewed the following data within the [facility] notes reviewed and incorporated, Labs reviewed, notes from prior ED visits and [place] Controlled Substance Database   ____________________________________________   FINAL CLINICAL IMPRESSION(S) / ED DIAGNOSES  Final diagnoses:  TIA (transient ischemic attack)      NEW MEDICATIONS STARTED DURING THIS VISIT:  New Prescriptions   No medications on file     Note:  This document was prepared using Dragon voice recognition software and may include unintentional dictation  errors.    Merlyn Lot, MD 01/19/21 2119

## 2021-01-19 NOTE — ED Notes (Signed)
Returned from CT.

## 2021-01-19 NOTE — ED Triage Notes (Addendum)
Per EMS called to patients home by patients granddaughter for right arm numbness radiating to right side of face and slurred speech. Per EMS when they arrived patients numbness had resolved. Patient reports taking 2 baby aspirin. Patient reports symptoms started at 1830-1900. On arrival to ER patient A&OX3, but patient states it is hard for her to get her words out. NIH 1 on arrival to ER. Patient reports headache at this time. BP 216/85.

## 2021-01-19 NOTE — ED Notes (Signed)
Patient transported to CT 

## 2021-01-20 ENCOUNTER — Observation Stay (HOSPITAL_BASED_OUTPATIENT_CLINIC_OR_DEPARTMENT_OTHER)
Admit: 2021-01-20 | Discharge: 2021-01-20 | Disposition: A | Discharge status: Home / Self Care | Payer: Medicare HMO | Attending: Internal Medicine | Admitting: Internal Medicine

## 2021-01-20 ENCOUNTER — Other Ambulatory Visit: Payer: Self-pay

## 2021-01-20 ENCOUNTER — Observation Stay: Payer: Medicare HMO

## 2021-01-20 DIAGNOSIS — K219 Gastro-esophageal reflux disease without esophagitis: Secondary | ICD-10-CM

## 2021-01-20 DIAGNOSIS — E785 Hyperlipidemia, unspecified: Secondary | ICD-10-CM | POA: Diagnosis not present

## 2021-01-20 DIAGNOSIS — E119 Type 2 diabetes mellitus without complications: Secondary | ICD-10-CM | POA: Diagnosis not present

## 2021-01-20 DIAGNOSIS — G459 Transient cerebral ischemic attack, unspecified: Secondary | ICD-10-CM | POA: Diagnosis not present

## 2021-01-20 LAB — BASIC METABOLIC PANEL
Anion gap: 11 (ref 5–15)
BUN: 11 mg/dL (ref 8–23)
CO2: 25 mmol/L (ref 22–32)
Calcium: 9.1 mg/dL (ref 8.9–10.3)
Chloride: 105 mmol/L (ref 98–111)
Creatinine, Ser: 0.63 mg/dL (ref 0.44–1.00)
GFR, Estimated: >60 mL/min (ref >60)
Glucose, Bld: 119 mg/dL — ABNORMAL HIGH (ref 70–99)
Potassium: 4 mmol/L (ref 3.5–5.1)
Sodium: 141 mmol/L (ref 135–145)

## 2021-01-20 LAB — GLUCOSE, CAPILLARY: Glucose-Capillary: 219 mg/dL — ABNORMAL HIGH (ref 70–99)

## 2021-01-20 LAB — CBC
HCT: 42 % (ref 36.0–46.0)
Hemoglobin: 13.6 g/dL (ref 12.0–15.0)
MCH: 30.5 pg (ref 26.0–34.0)
MCHC: 32.4 g/dL (ref 30.0–36.0)
MCV: 94.2 fL (ref 80.0–100.0)
Platelets: 212 10*3/uL (ref 150–400)
RBC: 4.46 MIL/uL (ref 3.87–5.11)
RDW: 13.2 % (ref 11.5–15.5)
WBC: 9.1 10*3/uL (ref 4.0–10.5)
nRBC: 0 % (ref 0.0–0.2)

## 2021-01-20 LAB — MAGNESIUM
Magnesium: 2 mg/dL (ref 1.7–2.4)
Magnesium: 2 mg/dL (ref 1.7–2.4)

## 2021-01-20 LAB — LIPID PANEL
Cholesterol: 150 mg/dL (ref 0–200)
HDL: 64 mg/dL (ref >40)
LDL Cholesterol: 72 mg/dL (ref 0–99)
Total CHOL/HDL Ratio: 2.3 RATIO
Triglycerides: 70 mg/dL (ref <150)
VLDL: 14 mg/dL (ref 0–40)

## 2021-01-20 LAB — ECHOCARDIOGRAM COMPLETE BUBBLE STUDY
AR max vel: 2.45 cm2
AV Peak grad: 3.5 mmHg
Ao pk vel: 0.93 m/s
Area-P 1/2: 3.68 cm2
S' Lateral: 2.59 cm

## 2021-01-20 LAB — SARS CORONAVIRUS 2 (TAT 6-24 HRS): SARS Coronavirus 2: NEGATIVE

## 2021-01-20 MED ORDER — SIMVASTATIN 20 MG PO TABS: 40.0000 mg | ORAL_TABLET | Freq: Every day | ORAL | Status: DC

## 2021-01-20 MED ORDER — SIMVASTATIN 40 MG PO TABS
40.0000 mg | ORAL_TABLET | Freq: Every day | ORAL | 0 refills | Status: AC
Start: 2021-01-20 — End: 2023-05-22

## 2021-01-20 MED ORDER — AMLODIPINE BESYLATE 5 MG PO TABS: 5.0000 mg | ORAL_TABLET | Freq: Every day | ORAL | 0 refills | Status: DC

## 2021-01-20 MED ORDER — ASPIRIN 81 MG PO CHEW: 81.0000 mg | CHEWABLE_TABLET | Freq: Once | ORAL | 0 refills | Status: AC

## 2021-01-20 NOTE — Evaluation (Signed)
Occupational Therapy Evaluation Patient Details Name: Susan Wolfe MRN: [id number] DOB: 09-19-1928 Today's Date: 01/20/2021    History of Present Illness Pt is a 87 y/o F admitted on 01/19/21 with suspected TIA after presenting from home complaining of acute onset of R sided numbness. CT head showed no evidence of acute intracranial process. PMH: DM2, GERD, TIA, HTN, breast CA (L)   Clinical Impression   Pt seen for OT evaluation this date in setting of acute hospitalization d/t R side numbness. Pt reports being MOD I for fxl mobility at baseline with 4WW and INDEP with self care ADLs and IADLs including running errands. Pt presents this date reporting that most of her symptoms have resolved. OT assesses sensation, ROM, strength and FMC and finds that pt is equal bilaterally. Pt reports she feels she is close to her functional baseline at this time. Pt demos ability to perform ADLs and ADL transfers with MOD I with RW used for fxl mobility. Pt with good safety awareness and control. Not further acute OT needs identified at this time nor do I anticipate pt will require f/u OT upon d/c from acute setting. Will complete order at this time. All education completed.    Follow Up Recommendations  No OT follow up    Equipment Recommendations  None recommended by OT    Recommendations for Other Services       Precautions / Restrictions Precautions Precautions: None Restrictions Weight Bearing Restrictions: No      Mobility Bed Mobility Overal bed mobility: Modified Independent             General bed mobility comments: sit>supine HOB elevated, no assisted    Transfers Overall transfer level: Modified independent                    Balance Overall balance assessment: Mild deficits observed, not formally tested   Sitting balance-Leahy Scale: Good     Standing balance support: No upper extremity supported;During functional activity Standing balance-Leahy Scale:  Good Standing balance comment: no overt LOB during gait without AD                           ADL either performed or assessed with clinical judgement   ADL Overall ADL's : Modified independent;At baseline                                             Vision Patient Visual Report: No change from baseline Additional Comments: pt with visual deficit in L eye at baseline     Perception     Praxis      Pertinent Vitals/Pain Pain Assessment: No/denies pain     Hand Dominance Right   Extremity/Trunk Assessment Upper Extremity Assessment Upper Extremity Assessment: Overall WFL for tasks assessed   Lower Extremity Assessment Lower Extremity Assessment: Overall WFL for tasks assessed       Communication Communication Communication: No difficulties   Cognition Arousal/Alertness: Awake/alert Behavior During Therapy: WFL for tasks assessed/performed Overall Cognitive Status: Within Functional Limits for tasks assessed                                 General Comments: AxOx4, very pleasant   General Comments  very pleasant, educated pt on need for  medical examination if symptoms occur at home again upon d/c as they could potentially indicate a stroke    Exercises Other Exercises Other Exercises: OT facilitaets ed re: role of OT in acute setting, safety and fall prevention considerations. Pt with good understanding.   Shoulder Instructions      Home Living Family/patient expects to be discharged to:: Private residence Living Arrangements: Spouse/significant other Available Help at Discharge: Family;Available 24 hours/day Type of Home: House Home Access: Stairs to enter [employer] of Steps: 2 at front door Entrance Stairs-Rails: Left Home Layout: Two level;Full bath on main level               Home Equipment: Walker - 4 wheels   Additional Comments: rollator      Prior Functioning/Environment Level of  Independence: Independent        Comments: Independent without AD in the home, only used rollator to ambulate to mailbox, still driving, cooking, lives with 22 y/o husband, reports 1 fall in past 6 months after reaching for something on ground then believing it to be a snake & falling        OT Problem List: Decreased activity tolerance;Impaired balance (sitting and/or standing)      OT Treatment/Interventions: Self-care/ADL training;Therapeutic activities    OT Goals(Current goals can be found in the care plan section) Acute Rehab OT Goals Patient Stated Goal: go home OT Goal Formulation: All assessment and education complete, DC therapy  OT Frequency:     Barriers to D/C:            Co-evaluation              AM-PAC OT "6 Clicks" Daily Activity     Outcome Measure Help from another person eating meals?: None Help from another person taking care of personal grooming?: None Help from another person toileting, which includes using toliet, bedpan, or urinal?: None Help from another person bathing (including washing, rinsing, drying)?: A Little Help from another person to put on and taking off regular upper body clothing?: None Help from another person to put on and taking off regular lower body clothing?: None 6 Click Score: 23   End of Session Equipment Utilized During Treatment: Rolling walker Nurse Communication: Mobility status;Other (comment) (aware of pt left status)  Activity Tolerance: Patient tolerated treatment well Patient left: Other (comment) (seated on commode in restroom with her personal belonging including clothing)  OT Visit Diagnosis: Muscle weakness (generalized) (M62.81)                Time: 8588-5027 OT Time Calculation (min): 22 min Charges:  OT General Charges $OT Visit: 1 Visit OT Evaluation $OT Eval Low Complexity: 1 Low OT Treatments $Self Care/Home Management : 8-22 mins  Gerrianne Scale, MS, OTR/L ascom [phone] 01/20/21, 5:26  PM

## 2021-01-20 NOTE — ED Notes (Signed)
Patient transported to MRI 

## 2021-01-20 NOTE — ED Notes (Signed)
Meds requested from pharmacy.

## 2021-01-20 NOTE — Evaluation (Signed)
Physical Therapy Evaluation Patient Details Name: Susan Wolfe MRN: [id number] DOB: 1928-09-27 Today's Date: 01/20/2021   History of Present Illness  Pt is a 87 y/o F admitted on 01/19/21 with suspected TIA after presenting from home complaining of acute onset of R sided numbness. CT head showed no evidence of acute intracranial process. PMH: DM2, GERD, TIA, HTN, breast CA (L)  Clinical Impression  Pt seen for PT evaluation with pt received on toilet performing peri hygiene without assistance, requiring only min assist to stand up from low toilet seat without grab bar. Pt is able to ambulate in room without AD & mod I with slow, steady gait. No overt LOB noted & pt reports she feels she's at her baseline level of function. There appears to be no further acute PT needs at this time, will sign off.     Follow Up Recommendations No PT follow up    Equipment Recommendations  None recommended by PT    Recommendations for Other Services       Precautions / Restrictions Precautions Precautions: None Restrictions Weight Bearing Restrictions: No      Mobility  Bed Mobility Overal bed mobility: Modified Independent             General bed mobility comments: sit>supine HOB elevated, no assisted    Transfers Overall transfer level: Modified independent                  Ambulation/Gait Ambulation/Gait assistance: Modified independent (Device/Increase time) Gait Distance (Feet): 75 Feet (+ 45 ft)   Gait Pattern/deviations: Decreased step length - right;Decreased step length - left;Decreased stride length;Decreased dorsiflexion - right;Decreased dorsiflexion - left;Decreased weight shift to right Gait velocity: decreased   General Gait Details: slightly decreased step length RLE compared to LLE, no overt LOB noted, slow steady gait  Stairs            Wheelchair Mobility    Modified Rankin (Stroke Patients Only)       Balance Overall balance assessment: Mild  deficits observed, not formally tested         Standing balance support: No upper extremity supported;During functional activity Standing balance-Leahy Scale: Good Standing balance comment: no overt LOB during gait without AD                             Pertinent Vitals/Pain Pain Assessment: No/denies pain    Home Living Family/patient expects to be discharged to:: Private residence Living Arrangements: Spouse/significant other Available Help at Discharge: Family;Available 24 hours/day Type of Home: House Home Access: Stairs to enter Entrance Stairs-Rails: Left Entrance Stairs-Number of Steps: 2 at front door Home Layout: Two level;Full bath on main level Home Equipment:  (rollator)      Prior Function Level of Independence: Independent         Comments: Independent without AD in the home, only used rollator to ambulate to mailbox, still driving, cooking, lives with 12 y/o husband, reports 1 fall in past 6 months after reaching for something on ground then believing it to be a snake & falling     Hand Dominance   Dominant Hand: Right    Extremity/Trunk Assessment   Upper Extremity Assessment Upper Extremity Assessment: Overall WFL for tasks assessed (BUE finger to nose equal)    Lower Extremity Assessment Lower Extremity Assessment: Overall WFL for tasks assessed (BLE heel to shin equal, denies numbness & tingling, proprioception & sensation (to light touch)  intact)       Communication   Communication: No difficulties  Cognition Arousal/Alertness: Awake/alert Behavior During Therapy: WFL for tasks assessed/performed Overall Cognitive Status: Within Functional Limits for tasks assessed                                 General Comments: AxOx4, very pleasant      General Comments General comments (skin integrity, edema, etc.): very pleasant, educated pt on need for medical examination if symptoms occur at home again upon d/c as they  could potentially indicate a stroke    Exercises     Assessment/Plan    PT Assessment Patent does not need any further PT services  PT Problem List         PT Treatment Interventions      PT Goals (Current goals can be found in the Care Plan section)  Acute Rehab PT Goals Patient Stated Goal: go home PT Goal Formulation: With patient Time For Goal Achievement: 02/03/21 Potential to Achieve Goals: Good    Frequency     Barriers to discharge        Co-evaluation               AM-PAC PT "6 Clicks" Mobility  Outcome Measure Help needed turning from your back to your side while in a flat bed without using bedrails?: None Help needed moving from lying on your back to sitting on the side of a flat bed without using bedrails?: None Help needed moving to and from a bed to a chair (including a wheelchair)?: None Help needed standing up from a chair using your arms (e.g., wheelchair or bedside chair)?: None Help needed to walk in hospital room?: None Help needed climbing 3-5 steps with a railing? : None 6 Click Score: 24    End of Session Equipment Utilized During Treatment: Gait belt Activity Tolerance: Patient tolerated treatment well Patient left: in bed;with call bell/phone within reach        Time: 4970-2637 PT Time Calculation (min) (ACUTE ONLY): 18 min   Charges:   PT Evaluation $PT Eval Low Complexity: 1 Low PT Treatments $Therapeutic Activity: 8-22 mins        Susan Wolfe, PT, DPT 01/20/21, 2:13 PM   Susan Wolfe 01/20/2021, 2:12 PM

## 2021-01-20 NOTE — Plan of Care (Signed)

## 2021-01-20 NOTE — Consult Note (Signed)
NEUROLOGY CONSULTATION NOTE   Date of service: January 20, 2021 Patient Name: Susan Wolfe MRN:  [id number] DOB:  [birth year] Reason for consult: "TIA" _ _ _   _ __   _ __ _ _  __ __   _ __   __ _  History of Present Illness  Susan Wolfe is a 87 y.o. female with PMH significant for Breast cancer in 1993, HTN, DM2 who presents with a self resolved episode of R arm numbness that traveled up to her neck and her lip alon with slurring of her speech. This lasted about 3 hours and then went away. She takes aspirin off and on but not regularly. No prior hx of stroke. She was sitting in the recliner when this happened.   ROS   Constitutional Denies weight loss, fever and chills.   HEENT Denies changes in vision and hearing.   Respiratory Denies SOB and cough.   CV Denies palpitations and CP   GI Denies abdominal pain, nausea, vomiting and diarrhea.   GU Denies dysuria and urinary frequency.   MSK Denies myalgia and joint pain.   Skin Denies rash and pruritus.   Neurological Denies headache and syncope.   Psychiatric Denies recent changes in mood. Denies anxiety and depression.    Past History   Past Medical History:  Diagnosis Date  . Breast cancer ([place]) 1993  . Cancer (HCC)    Left Breast CA, mastectomy. No other tx's.  . Diabetes mellitus without complication (HCC)    Metformin and Lantus  . HTN (hypertension) 01/19/2021   Past Surgical History:  Procedure Laterality Date  . BREAST EXCISIONAL BIOPSY Right 1990's  . MASTECTOMY Left 1993   Family History  Problem Relation Age of Onset  . Breast cancer Neg Hx    Social History   Socioeconomic History  . Marital status: Married    Spouse name: Not on file  . Number of children: Not on file  . Years of education: Not on file  . Highest education level: Not on file  Occupational History  . Not on file  Tobacco Use  . Smoking status: Never Smoker  . Smokeless tobacco: Never Used  Vaping Use  . Vaping Use: Never used   Substance and Sexual Activity  . Alcohol use: Not Currently  . Drug use: Never  . Sexual activity: Not on file  Other Topics Concern  . Not on file  Social History Narrative  . Not on file   Social Determinants of Health   Financial Resource Strain: Not on file  Food Insecurity: Not on file  Transportation Needs: Not on file  Physical Activity: Not on file  Stress: Not on file  Social Connections: Not on file   Allergies  Allergen Reactions  . Penicillins Hives  . Ace Inhibitors     Other reaction(s): Cough  . Amoxicillin     Other reaction(s): Vomiting  . Clarithromycin Hives  . Lovastatin     Other reaction(s): Other (See Comments) Fatigue  . Other Hives    Jergen's  . Oxycodone-Acetaminophen Nausea Only    Other reaction(s): Vomiting  . Rabeprazole Diarrhea  . Rosiglitazone Diarrhea  . Valsartan     Other reaction(s): Angioedema    Medications   Medications Prior to Admission  Medication Sig Dispense Refill Last Dose  . cyanocobalamin 1000 MCG tablet Take 1,000 mcg by mouth daily.   01/19/2021 at 0900  . dorzolamide-timolol (COSOPT) 22.3-6.8 MG/ML ophthalmic solution Place 1 drop  into the right eye 2 (two) times daily.   01/19/2021 at 0900  . hydrochlorothiazide (HYDRODIURIL) 25 MG tablet Take 25 mg by mouth daily as needed.   unknown at prn  . Insulin Glargine (BASAGLAR KWIKPEN) 100 UNIT/ML Inject 10-24 Units into the skin 2 (two) times daily.   01/19/2021 at 0900  . metFORMIN (GLUCOPHAGE) 500 MG tablet Take 500 mg by mouth 2 (two) times daily.   01/19/2021 at 0900  . omeprazole (PRILOSEC) 40 MG capsule Take 40 mg by mouth daily.   01/18/2021 at 1700  . simvastatin (ZOCOR) 20 MG tablet Take 20 mg by mouth at bedtime.   01/18/2021 at 2000  . fluticasone (FLONASE) 50 MCG/ACT nasal spray Place 1 spray into both nostrils daily for 7 days. 9.9 mL 0   . hydrOXYzine (ATARAX/VISTARIL) 25 MG tablet Take 25 mg by mouth 2 (two) times daily. (Patient not taking: No sig  reported)   Not Taking at Unknown time     Vitals   Vitals:   01/20/21 1100 01/20/21 1200 01/20/21 1300 01/20/21 1438  BP: (!) 158/68 (!) 175/63 (!) 174/76 (!) 191/163  Pulse: 74 74 85 81  Resp: 19 17 19 16   Temp:    97.8 F (36.6 C)  TempSrc:    Oral  SpO2: 95% 97% 98% 97%  Weight:      Height:         Body mass index is 25.79 kg/m.  Physical Exam   General: Laying comfortably in bed; in no acute distress.  HENT: Normal oropharynx and mucosa. Normal external appearance of ears and nose.  Neck: Supple, no pain or tenderness  CV: No JVD. No peripheral edema.  Pulmonary: Symmetric Chest rise. Normal respiratory effort.  Abdomen: Soft to touch, non-tender.  Ext: No cyanosis, edema, or deformity  Skin: No rash. Normal palpation of skin.   Musculoskeletal: Normal digits and nails by inspection. No clubbing.   Neurologic Examination  Mental status/Cognition: Alert, oriented to self, place, month and year, good attention.  Speech/language: Fluent, comprehension intact, object naming intact, repetition intact.  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)   CN III,IV,VI EOM intact, no gaze preference or deviation, no nystagmus    CN V normal sensation in V1, V2, and V3 segments bilaterally    CN VII no asymmetry, no nasolabial fold flattening    CN VIII normal hearing to speech    CN IX & X normal palatal elevation, no uvular deviation    CN XI 5/5 head turn and 5/5 shoulder shrug bilaterally    CN XII midline tongue protrusion    Motor:  Muscle bulk: poor, tone normal, pronator drift none tremor none Mvmt Root Nerve  Muscle Right Left Comments  SA C5/6 Ax Deltoid 5 5   EF C5/6 Mc Biceps 5 5   EE C6/7/8 Rad Triceps 5 5   WF C6/7 Med FCR 5 5   WE C7/8 PIN ECU 5 5   F Ab C8/T1 U ADM/FDI 5 5   HF L1/2/3 Fem Illopsoas 5 5   KE L2/3/4 Fem  Quad 5 5   DF L4/5 D Peron Tib Ant 5 5   PF S1/2 Tibial Grc/Sol 5 5    Reflexes:  Right Left Comments  Pectoralis      Biceps (C5/6) 1 1   Brachioradialis (C5/6) 1 1  Triceps (C6/7) 1 1    Patellar (L3/4) 1 1    Achilles (S1)      Hoffman      Plantar     Jaw jerk    Sensation:  Light touch Intact throughout   Pin prick    Temperature    Vibration   Proprioception    Coordination/Complex Motor:  - Finger to Nose intact BL - Heel to shin intact BL - Rapid alternating movement are slowed. - Gait: deferred.  Labs   CBC:  Recent Labs  Lab 01/19/21 2010 01/20/21 0435  WBC 6.4 9.1  NEUTROABS 3.3  --   HGB 10.8* 13.6  HCT 34.2* 42.0  MCV 98.0 94.2  PLT 183 627    Basic Metabolic Panel:  Lab Results  Component Value Date   NA 141 01/20/2021   K 4.0 01/20/2021   CO2 25 01/20/2021   GLUCOSE 119 (H) 01/20/2021   BUN 11 01/20/2021   CREATININE 0.63 01/20/2021   CALCIUM 9.1 01/20/2021   GFRNONAA >60 01/20/2021   Lipid Panel:  Lab Results  Component Value Date   LDLCALC 72 01/20/2021   HgbA1c: No results found for: HGBA1C Urine Drug Screen: No results found for: LABOPIA, COCAINSCRNUR, LABBENZ, AMPHETMU, THCU, LABBARB  Alcohol Level No results found for: [place]  CT Head without contrast: CTH was negative for a large hypodensity concerning for a large territory infarct or hyperdensity concerning for an [place]  CT angio Head and Neck with contrast: .No LVO, has atherosclerosis  MRI Brain no Stroke  Impression   RAND BOLLER is a 87 y.o. female with PMH significant for Breast cancer in 1993, HTN, DM2 who presents with a self resolved episode of R arm numbness that traveled up to her neck and her lip alon with slurring of her speech, lasted 3 hours. Her neurologic examination is notable for no focal deficit. Suspect that this was a TIA.  Recommendations  Stroke workup has been completed with negative MRI Brain, no LVO on CTA, TTE with EF of 60-65%, no shunt.  LDL is 72, increased simvastatin to 40mg  daily and discussed to let her doctors know if she has any lethargy or tiredness from this. Start Aspirin 81mg  daily. Seen by PT and recommened no outpatient PT.  Okay to discharge home with follow up locally with neurologist at [facility]. ______________________________________________________________________   Thank you for the opportunity to take part in the care of this patient. If you have any further questions, please contact the neurology consultation attending.  Signed,  Bells Pager Number [phone] _ _ _   _ __   _ __ _ _  __ __   _ __   __ _

## 2021-01-20 NOTE — Progress Notes (Signed)
MD orders to recheck BP. Notified of hypertension, MD ok with BP level and gave orders for patient to DC home. Pt in agreement at this time.

## 2021-01-20 NOTE — ED Notes (Signed)
Secure chat notification sent to [facility] NP re: pupil change and confusion upon waking.

## 2021-01-20 NOTE — Progress Notes (Signed)
*  PRELIMINARY RESULTS* Echocardiogram 2D Echocardiogram has been performed.  Susan Wolfe 01/20/2021, 11:41 AM

## 2021-01-20 NOTE — ED Notes (Addendum)
Per [place] NP, no new orders at this time.   Contacted MRI to verify that test will be done this morning when second tech available to assist.

## 2021-01-20 NOTE — Progress Notes (Signed)
SLP Cancellation Note  Patient Details Name: Susan Wolfe MRN: [id number] DOB: Dec 31, 1927   Cancelled treatment:       Reason Eval/Treat Not Completed: SLP screened, no needs identified, will sign off. CT/MRI negative for acute findings, patient passed Ray and on regular diet. As pt at baseline level of function no skilled need identified.  Deneise Lever, [place], CCC-SLP Speech-Language Pathologist    Susan Wolfe 01/20/2021, 2:37 PM

## 2021-01-20 NOTE — Discharge Instructions (Signed)
Transient Ischemic Attack A transient ischemic attack (TIA) causes the same symptoms as a stroke, but the symptoms go away quickly. A TIA happens when blood flow to the brain is blocked. Having a TIA means you may be at risk for a stroke. A TIA is a medical emergency. What are the causes? A TIA is caused by a blocked artery in the head or neck. This means the brain does not get the blood supply it needs. A blockage can be caused by:  Fatty buildup in an artery in the head or neck.  A blood clot.  A tear in an artery.  Irritation and swelling (inflammation) of an artery. Sometimes the cause is not known. What increases the risk? Certain things may make you more likely to have a TIA. Some of these are things that you can change, such as:  Being very overweight.  Using products that have nicotine or tobacco.  Taking birth control pills.  Not being active.  Drinking too much alcohol.  Using drugs. Health conditions that may increase your risk include:  High blood pressure.  High cholesterol.  Diabetes.  Heart disease.  A heartbeat that is not regular (atrial fibrillation).  Sickle cell disease.  Sleep problems (sleep apnea).  Long-term diseases that cause irritation and swelling.  Problems with blood clotting. Other risk factors include:  Being over the age of 19.  Being female.  Having a family history of stroke.  Having had blood clots, stroke, TIA, or heart attack in the past.  Having a history of high blood pressure when pregnant (preeclampsia).  Very bad headaches (migraines). What are the signs or symptoms? The symptoms of a TIA are like those of a stroke. They can include:  Weakness or loss of feeling in your face, arm, or leg. This often happens on one side of your body.  Trouble walking.  Trouble moving your arms or legs.  Trouble talking or understanding what people are saying.  Problems with how you see.  Feeling dizzy.  Feeling  confused.  Loss of balance or coordination.  Feeling like you may vomit (nausea) or you vomit.  Having a very bad headache. If you can, note what time you started to have symptoms. Tell your doctor.   How is this treated? The goal of treatment is to lower the risk for a stroke. This may include:  Changes to diet and lifestyle, such as getting regular exercise and stopping smoking.  Taking medicines to: ? Thin the blood. ? Lower blood pressure. ? Lower cholesterol.  Treating other health conditions, such as diabetes. If testing shows that an artery in your brain is narrow, your doctor may recommend a procedure to:  Take the blockage out of your artery (carotid endarterectomy).  Open or widen an artery in your neck (carotid angioplasty and stenting). Follow these instructions at home: Medicines  Take over-the-counter and prescription medicines only as told by your doctor.  If you were told to take aspirin or another medicine to thin your blood, take it exactly as told by your doctor. ? Taking too much of the medicine can cause bleeding. ? Taking too little of the medicine may not work to treat the problem. Eating and drinking  Eat 5 or more servings of fruits and vegetables each day.  Follow instructions from your doctor about your diet. You may need to follow a certain diet to help lower your risk of a stroke. You may need to: ? Eat a diet that is low  in fat and salt. ? Eat foods with a lot of fiber. ? Limit carbohydrates and sugar.  If you drink alcohol: ? Limit how much you have to:  0-1 drink a day for women who are not pregnant.  0-2 drinks a day for men. ? Know how much alcohol is in a drink. In the [place] one drink equals one 12 oz bottle of beer (358m), one 5 oz glass of wine (1413m, or one 1 oz glass of hard liquor (4423m   General instructions  Keep a healthy weight.  Try to get at least 30 minutes of exercise on most days.  Get treatment if you have  sleep problems.  Do not smoke or use any products that contain nicotine or tobacco. If you need help quitting, ask your doctor.  Do not use drugs.  Keep all follow-up visits. Where to find more information  American Stroke Association: www.stroke.org Get help right away if:  You have chest pain.  You have a heartbeat that is not regular.  You have any signs of a stroke. "BE FAST" is an easy way to remember the main warning signs: ? B - Balance. Dizziness, sudden trouble walking, or loss of balance. ? E - Eyes. Trouble seeing or a change in how you see. ? F - Face. Sudden weakness or loss of feeling of the face. The face or eyelid may droop on one side. ? A - Arms. Weakness or loss of feeling in an arm. This happens all of a sudden and most often on one side of the body. ? S - Speech. Sudden trouble speaking, slurred speech, or trouble understanding what people say. ? T - Time. Time to call emergency services. Write down what time symptoms started.  You have other signs of a stroke, such as: ? A sudden, very bad headache with no known cause. ? Feeling like you may vomit. ? Vomiting. ? A seizure. These symptoms may be an emergency. Get help right away. Call your local emergency services (911 in the [place]  Do not wait to see if the symptoms will go away.  Do not drive yourself to the hospital. Summary  A transient ischemic attack (TIA) happens when an artery in the head or neck is blocked. This causes the same symptoms as a stroke. The symptoms go away quickly.  A TIA is a medical emergency. Get help right away, even if your symptoms go away.  Having a TIA means that you may be at risk for a stroke. Taking medicines and making diet and lifestyle changes can help to prevent a stroke. This information is not intended to replace advice given to you by your health care provider. Make sure you discuss any questions you have with your health care provider. Document Revised:  06/13/2020 Document Reviewed: 06/13/2020 Elsevier Patient Education  [address]

## 2021-01-20 NOTE — ED Notes (Addendum)
ER staff member notified RN of pt standing in doorway asking for help to go to the bathroom. Pt assisted to toilet. Pt oriented to person, place, and disoriented to date. AM labs collected.

## 2021-01-21 LAB — HEMOGLOBIN A1C
Hgb A1c MFr Bld: 7.4 % — ABNORMAL HIGH (ref 4.8–5.6)
Mean Plasma Glucose: 165.68 mg/dL

## 2021-01-21 NOTE — Discharge Summary (Signed)
[facility] [place] NAME: Susan Wolfe    MR#:  [id number]  DATE OF BIRTH:  Apr 15, 1928  DATE OF ADMISSION:  01/19/2021   ADMITTING PHYSICIAN: Rhetta Mura, DO  DATE OF DISCHARGE: 01/20/2021  6:08 PM  PRIMARY CARE PHYSICIAN: Rusty Aus, MD   ADMISSION DIAGNOSIS:  TIA (transient ischemic attack) [G45.9] DISCHARGE DIAGNOSIS:  Principal Problem:   TIA (transient ischemic attack) Active Problems:   HTN (hypertension)   Diabetes mellitus without complication (HCC)   GERD (gastroesophageal reflux disease)   HLD (hyperlipidemia)  SECONDARY DIAGNOSIS:   Past Medical History:  Diagnosis Date  . Breast cancer ([place]) 1993  . Cancer (HCC)    Left Breast CA, mastectomy. No other tx's.  . Diabetes mellitus without complication (HCC)    Metformin and Lantus  . HTN (hypertension) 01/19/2021   HOSPITAL COURSE:  Susan Wolfe is a 87 y.o. female with PMH significant for Breast cancer in 1993, HTN, DM2 admitted for a self resolved episode of R arm numbness that traveled up to her neck and her lip alon with slurring of her speech, lasted 3 hours. Her neurologic examination is notable for no focal deficit. Neuro suspect this as TIA.  TIA Stroke workup has been completed with negative MRI Brain, no LVO on CTA, TTE with EF of 60-65%, no shunt. LDL is 72, increased simvastatin to 40mg  daily and Neuro discussed to let her doctors know if she has any lethargy or tiredness from this. continue Aspirin 81mg  daily. Seen by PT and recommened no outpatient PT. - recommend outpt f/up with neurologist at [facility]  DM HTN GERD    DISCHARGE CONDITIONS:  Stable CONSULTS OBTAINED:  Treatment Team:  Babs Bertin B, DO DRUG ALLERGIES:   Allergies  Allergen Reactions  . Penicillins Hives  . Ace Inhibitors     Other reaction(s): Cough  . Amoxicillin     Other reaction(s): Vomiting  . Clarithromycin Hives  . Lovastatin     Other reaction(s): Other  (See Comments) Fatigue  . Other Hives    Jergen's  . Oxycodone-Acetaminophen Nausea Only    Other reaction(s): Vomiting  . Rabeprazole Diarrhea  . Rosiglitazone Diarrhea  . Valsartan     Other reaction(s): Angioedema   DISCHARGE MEDICATIONS:   Allergies as of 01/20/2021      Reactions   Penicillins Hives   Ace Inhibitors    Other reaction(s): Cough   Amoxicillin    Other reaction(s): Vomiting   Clarithromycin Hives   Lovastatin    Other reaction(s): Other (See Comments) Fatigue   Other Hives   Jergen's   Oxycodone-acetaminophen Nausea Only   Other reaction(s): Vomiting   Rabeprazole Diarrhea   Rosiglitazone Diarrhea   Valsartan    Other reaction(s): Angioedema      Medication List    STOP taking these medications   hydrOXYzine 25 MG tablet Commonly known as: ATARAX/VISTARIL     TAKE these medications   amLODipine 5 MG tablet Commonly known as: NORVASC Take 1 tablet (5 mg total) by mouth daily.   Basaglar KwikPen 100 UNIT/ML Inject 10-24 Units into the skin 2 (two) times daily.   cyanocobalamin 1000 MCG tablet Take 1,000 mcg by mouth daily.   dorzolamide-timolol 22.3-6.8 MG/ML ophthalmic solution Commonly known as: COSOPT Place 1 drop into the right eye 2 (two) times daily.   fluticasone 50 MCG/ACT nasal spray Commonly known as: Flonase Place 1 spray into both nostrils daily for 7 days.  hydrochlorothiazide 25 MG tablet Commonly known as: HYDRODIURIL Take 25 mg by mouth daily as needed.   metFORMIN 500 MG tablet Commonly known as: GLUCOPHAGE Take 500 mg by mouth 2 (two) times daily.   omeprazole 40 MG capsule Commonly known as: PRILOSEC Take 40 mg by mouth daily.   simvastatin 40 MG tablet Commonly known as: ZOCOR Take 1 tablet (40 mg total) by mouth at bedtime. What changed:   medication strength  how much to take     ASK your doctor about these medications   aspirin 81 MG chewable tablet Chew 1 tablet (81 mg total) by mouth once for  1 dose. Ask about: Should I take this medication?      DISCHARGE INSTRUCTIONS:   DIET:  Diabetic diet DISCHARGE CONDITION:  Stable ACTIVITY:  Activity as tolerated OXYGEN:  Home Oxygen: No.  Oxygen Delivery: room air DISCHARGE LOCATION:  home   If you experience worsening of your admission symptoms, develop shortness of breath, life threatening emergency, suicidal or homicidal thoughts you must seek medical attention immediately by calling 911 or calling your MD immediately  if symptoms less severe.  You Must read complete instructions/literature along with all the possible adverse reactions/side effects for all the Medicines you take and that have been prescribed to you. Take any new Medicines after you have completely understood and accpet all the possible adverse reactions/side effects.   Please note  You were cared for by a hospitalist during your hospital stay. If you have any questions about your discharge medications or the care you received while you were in the hospital after you are discharged, you can call the unit and asked to speak with the hospitalist on call if the hospitalist that took care of you is not available. Once you are discharged, your primary care physician will handle any further medical issues. Please note that NO REFILLS for any discharge medications will be authorized once you are discharged, as it is imperative that you return to your primary care physician (or establish a relationship with a primary care physician if you do not have one) for your aftercare needs so that they can reassess your need for medications and monitor your lab values.    On the day of Discharge:  VITAL SIGNS:  Blood pressure (!) 193/74, pulse 78, temperature 98.7 F (37.1 C), temperature source Oral, resp. rate 16, height 5\' 5"  (1.651 m), weight 70.3 kg, SpO2 100 %. PHYSICAL EXAMINATION:  GENERAL:  87 y.o.-year-old patient lying in the bed with no acute distress.  EYES: Pupils  equal, round, reactive to light and accommodation. No scleral icterus. Extraocular muscles intact.  HEENT: Head atraumatic, normocephalic. Oropharynx and nasopharynx clear.  NECK:  Supple, no jugular venous distention. No thyroid enlargement, no tenderness.  LUNGS: Normal breath sounds bilaterally, no wheezing, rales,rhonchi or crepitation. No use of accessory muscles of respiration.  CARDIOVASCULAR: S1, S2 normal. No murmurs, rubs, or gallops.  ABDOMEN: Soft, non-tender, non-distended. Bowel sounds present. No organomegaly or mass.  EXTREMITIES: No pedal edema, cyanosis, or clubbing.  NEUROLOGIC: Cranial nerves II through XII are intact. Muscle strength 5/5 in all extremities. Sensation intact. Gait not checked.  PSYCHIATRIC: The patient is alert and oriented x 3.  SKIN: No obvious rash, lesion, or ulcer.  DATA REVIEW:   CBC Recent Labs  Lab 01/20/21 0435  WBC 9.1  HGB 13.6  HCT 42.0  PLT 212    Chemistries  Recent Labs  Lab 01/19/21 2010 01/20/21 0435  NA 138  141  K 3.9 4.0  CL 104 105  CO2 27 25  GLUCOSE 246* 119*  BUN 16 11  CREATININE 0.66 0.63  CALCIUM 8.7* 9.1  MG 2.0 2.0  AST 15  --   ALT 11  --   ALKPHOS 95  --   BILITOT 0.5  --      Outpatient follow-up  Follow-up Information    Rusty Aus, MD. Schedule an appointment as soon as possible for a visit in 3 days.   Specialty: Internal Medicine Why: patient to make own follow up appointment Contact information: [place] [address] [phone]        Vladimir Crofts, MD. Schedule an appointment as soon as possible for a visit in 1 week.   Specialty: Neurology Why: patient to make own follow up appointment Contact information: [facility] [place] [address] [phone]                  Management plans discussed with the patient, family and they are in agreement.  CODE STATUS:  Prior   TOTAL TIME TAKING CARE OF THIS PATIENT: 45 minutes.    Max Sane M.D on 01/21/2021 at 8:19 PM  [facility]   CC: Primary care physician; Rusty Aus, MD   Note: This dictation was prepared with Dragon dictation along with smaller phrase technology. Any transcriptional errors that result from this process are unintentional.

## 2021-06-27 ENCOUNTER — Other Ambulatory Visit: Payer: Self-pay | Admitting: Internal Medicine

## 2021-06-27 DIAGNOSIS — Z1231 Encounter for screening mammogram for malignant neoplasm of breast: Secondary | ICD-10-CM

## 2021-08-07 ENCOUNTER — Other Ambulatory Visit: Payer: Self-pay

## 2021-08-07 ENCOUNTER — Ambulatory Visit
Admission: RE | Admit: 2021-08-07 | Discharge: 2021-08-07 | Disposition: A | Discharge status: Home / Self Care | Payer: Medicare HMO | Source: Ambulatory Visit | Attending: Internal Medicine | Admitting: Internal Medicine

## 2021-08-07 DIAGNOSIS — Z1231 Encounter for screening mammogram for malignant neoplasm of breast: Secondary | ICD-10-CM | POA: Insufficient documentation

## 2021-09-24 ENCOUNTER — Other Ambulatory Visit: Payer: Self-pay

## 2021-09-24 ENCOUNTER — Other Ambulatory Visit: Payer: Self-pay | Admitting: Internal Medicine

## 2021-09-24 ENCOUNTER — Ambulatory Visit
Admission: RE | Admit: 2021-09-24 | Discharge: 2021-09-24 | Disposition: A | Discharge status: Home / Self Care | Payer: Medicare HMO | Source: Ambulatory Visit | Attending: Internal Medicine | Admitting: Internal Medicine

## 2021-09-24 DIAGNOSIS — W19XXXA Unspecified fall, initial encounter: Secondary | ICD-10-CM

## 2021-09-24 DIAGNOSIS — R296 Repeated falls: Secondary | ICD-10-CM | POA: Insufficient documentation

## 2021-09-24 DIAGNOSIS — R519 Headache, unspecified: Secondary | ICD-10-CM

## 2021-09-24 DIAGNOSIS — G8929 Other chronic pain: Secondary | ICD-10-CM

## 2021-12-25 ENCOUNTER — Other Ambulatory Visit: Payer: Self-pay

## 2021-12-25 ENCOUNTER — Ambulatory Visit
Admission: RE | Admit: 2021-12-25 | Discharge: 2021-12-25 | Disposition: A | Discharge status: Home / Self Care | Payer: Medicare HMO | Source: Ambulatory Visit | Attending: Physician Assistant | Admitting: Physician Assistant

## 2021-12-25 ENCOUNTER — Other Ambulatory Visit: Payer: Self-pay | Admitting: Physician Assistant

## 2021-12-25 DIAGNOSIS — R202 Paresthesia of skin: Secondary | ICD-10-CM | POA: Insufficient documentation

## 2022-05-02 ENCOUNTER — Emergency Department: Payer: Medicare HMO

## 2022-05-02 ENCOUNTER — Emergency Department
Admission: EM | Admit: 2022-05-02 | Discharge: 2022-05-02 | Disposition: A | Discharge status: Home / Self Care | Payer: Medicare HMO | Attending: Emergency Medicine | Admitting: Emergency Medicine

## 2022-05-02 ENCOUNTER — Encounter: Payer: Self-pay | Admitting: *Deleted

## 2022-05-02 ENCOUNTER — Other Ambulatory Visit: Payer: Self-pay

## 2022-05-02 DIAGNOSIS — R638 Other symptoms and signs concerning food and fluid intake: Secondary | ICD-10-CM | POA: Diagnosis not present

## 2022-05-02 DIAGNOSIS — R112 Nausea with vomiting, unspecified: Secondary | ICD-10-CM | POA: Diagnosis present

## 2022-05-02 DIAGNOSIS — K529 Noninfective gastroenteritis and colitis, unspecified: Secondary | ICD-10-CM | POA: Diagnosis not present

## 2022-05-02 LAB — TROPONIN I (HIGH SENSITIVITY): Troponin I (High Sensitivity): 8 ng/L (ref <18)

## 2022-05-02 LAB — BASIC METABOLIC PANEL
Anion gap: 9 (ref 5–15)
BUN: 15 mg/dL (ref 8–23)
CO2: 20 mmol/L — ABNORMAL LOW (ref 22–32)
Calcium: 8.1 mg/dL — ABNORMAL LOW (ref 8.9–10.3)
Chloride: 105 mmol/L (ref 98–111)
Creatinine, Ser: 0.6 mg/dL (ref 0.44–1.00)
GFR, Estimated: >60 mL/min (ref >60)
Glucose, Bld: 250 mg/dL — ABNORMAL HIGH (ref 70–99)
Potassium: 4.2 mmol/L (ref 3.5–5.1)
Sodium: 134 mmol/L — ABNORMAL LOW (ref 135–145)

## 2022-05-02 LAB — CBC
HCT: 41.6 % (ref 36.0–46.0)
Hemoglobin: 13.1 g/dL (ref 12.0–15.0)
MCH: 29.4 pg (ref 26.0–34.0)
MCHC: 31.5 g/dL (ref 30.0–36.0)
MCV: 93.3 fL (ref 80.0–100.0)
Platelets: 191 10*3/uL (ref 150–400)
RBC: 4.46 MIL/uL (ref 3.87–5.11)
RDW: 13.2 % (ref 11.5–15.5)
WBC: 8.1 10*3/uL (ref 4.0–10.5)
nRBC: 0 % (ref 0.0–0.2)

## 2022-05-02 MED ORDER — SODIUM CHLORIDE 0.9 % IV BOLUS
500.0000 mL | Freq: Once | INTRAVENOUS | Status: AC
  Administered 2022-05-02: 500 mL via INTRAVENOUS

## 2022-05-02 MED ORDER — ONDANSETRON HCL 4 MG/2ML IJ SOLN
4.0000 mg | Freq: Once | INTRAMUSCULAR | Status: AC
  Administered 2022-05-02: 4 mg via INTRAVENOUS
  Filled 2022-05-02: qty 2

## 2022-05-02 MED ORDER — ONDANSETRON HCL 4 MG PO TABS: 4.0000 mg | ORAL_TABLET | Freq: Three times a day (TID) | ORAL | 0 refills | Status: DC | PRN

## 2022-05-02 MED ORDER — IOHEXOL 300 MG/ML  SOLN
100.0000 mL | Freq: Once | INTRAMUSCULAR | Status: AC | PRN
Start: 2022-05-02 — End: 2022-05-02
  Administered 2022-05-02: 75 mL via INTRAVENOUS

## 2022-05-02 NOTE — ED Triage Notes (Signed)
Pt brought in via ems from home with weakness, vomiting and diarrhea for 4 days.  No abd pain.  No chest pain or sob.  Pt alert  speech clear. Iv in place

## 2022-05-02 NOTE — ED Provider Notes (Signed)
[facility] Provider Note    Event Date/Time   First MD Initiated Contact with Patient 05/02/22 1801     (approximate)   History   Weakness, Emesis, and Diarrhea   HPI  Susan Wolfe is a 88 y.o. female  who, per PCP note dated 03/15/22 was being seen for pain in the left abdomen that has been there for months, who presents to the emergency department today because of concern for nausea vomiting and abdominal pain.  Patient states that her symptoms started 2 days ago.  She has had multiple episodes of nonbloody emesis.  This has been accompanied by left sided abdominal pain.  Yesterday she started developing nonbloody diarrhea.  She has had poor oral intake.  She denies any fevers or chills.  Denies any unusual ingestions.  Physical Exam   Triage Vital Signs: ED Triage Vitals  Enc Vitals Group     BP 05/02/22 1510 138/67     Pulse Rate 05/02/22 1510 (!) 111     Resp 05/02/22 1510 20     Temp 05/02/22 1510 97.8 F (36.6 C)     Temp Source 05/02/22 1510 Oral     SpO2 05/02/22 1510 95 %     Weight 05/02/22 1511 158 lb (71.7 kg)     Height 05/02/22 1511 '5\' 3"'$  (1.6 m)     Head Circumference --      Peak Flow --      Pain Score 05/02/22 1511 0   Most recent vital signs: Vitals:   05/02/22 1510  BP: 138/67  Pulse: (!) 111  Resp: 20  Temp: 97.8 F (36.6 C)  SpO2: 95%    General: Awake, alert, oriented. CV:  Good peripheral perfusion. Tachycardic, regular rate. Resp:  Normal effort. Lungs clear. Abd:  No distention. Tender to palpation in the left abdomen.   ED Results / Procedures / Treatments   Labs (all labs ordered are listed, but only abnormal results are displayed) Labs Reviewed  BASIC METABOLIC PANEL - Abnormal; Notable for the following components:      Result Value   Sodium 134 (*)    CO2 20 (*)    Glucose, Bld 250 (*)    Calcium 8.1 (*)    All other components within normal limits  CBC  TROPONIN I (HIGH SENSITIVITY)   TROPONIN I (HIGH SENSITIVITY)     EKG  I, Nance Pear, attending physician, personally viewed and interpreted this EKG  EKG Time: 1519 Rate: 111 Rhythm: sinus tachycardia Axis: left axis deviation Intervals: qtc 459 QRS: incomplete RBBB ST changes: no st elevation Impression: abnormal ekg   RADIOLOGY I independently interpreted and visualized the CXR. My interpretation: No pneumonia. No pneumothorax.  Radiology interpretation:  IMPRESSION:  No active cardiopulmonary disease.      I independently interpreted and visualized the CT abd/pel. My interpretation: No free air Radiology interpretation:    IMPRESSION:  No acute findings in the abdomen or pelvis.     Cholelithiasis.  No CT evidence for acute cholecystitis.     Coronary artery disease, aortic atherosclerosis.     Sigmoid diverticulosis.      PROCEDURES:  Critical Care performed: No  Procedures   MEDICATIONS ORDERED IN ED: Medications - No data to display   IMPRESSION / MDM / [place] / ED COURSE  I reviewed the triage vital signs and the nursing notes.  Differential diagnosis includes, but is not limited to, diverticulitis, perforation, gastroenteritis, mesenteric ischemia.  Patient's presentation is most consistent with acute presentation with potential threat to life or bodily function.  Patient presented to the emergency department today because of concerns for nausea vomiting and abdominal pain.  On exam patient did have some tenderness to the left side of her abdomen.  Blood work without any concerning leukocytosis.  Given concern for possible significant intra-abdominal infection or pathology CT scan was obtained.  This did not show any concerning findings.  This time I do wonder if patient is suffering from gastroenteritis.  We will plan on discharging with nausea medication. Discussed findings and plan with patient.    FINAL CLINICAL IMPRESSION(S)  / ED DIAGNOSES   Final diagnoses:  Gastroenteritis     Note:  This document was prepared using Dragon voice recognition software and may include unintentional dictation errors.    Nance Pear, MD 05/02/22 2029

## 2022-05-02 NOTE — Discharge Instructions (Signed)
Please seek medical attention for any high fevers, chest pain, shortness of breath, change in behavior, persistent vomiting, bloody stool or any other new or concerning symptoms.  

## 2022-05-02 NOTE — ED Triage Notes (Signed)
Pt presents to ED from home via EMS with c/o weakness for the past 4 days and nausea/vomiting/diarrhea today. EMS vital signs - 128/74  HR 111  FSBS 300

## 2022-06-07 ENCOUNTER — Other Ambulatory Visit
Admission: RE | Admit: 2022-06-07 | Discharge: 2022-06-07 | Disposition: A | Discharge status: Home / Self Care | Payer: Medicare HMO | Source: Ambulatory Visit | Attending: Family Medicine | Admitting: Family Medicine

## 2022-06-07 DIAGNOSIS — M7989 Other specified soft tissue disorders: Secondary | ICD-10-CM | POA: Diagnosis present

## 2022-06-07 DIAGNOSIS — S80812A Abrasion, left lower leg, initial encounter: Secondary | ICD-10-CM | POA: Diagnosis present

## 2022-06-07 LAB — BRAIN NATRIURETIC PEPTIDE: B Natriuretic Peptide: 58.6 pg/mL (ref 0.0–100.0)

## 2022-09-13 ENCOUNTER — Other Ambulatory Visit: Payer: Self-pay | Admitting: Internal Medicine

## 2022-09-13 DIAGNOSIS — Z1231 Encounter for screening mammogram for malignant neoplasm of breast: Secondary | ICD-10-CM

## 2022-10-16 ENCOUNTER — Ambulatory Visit
Admission: RE | Admit: 2022-10-16 | Discharge: 2022-10-16 | Disposition: A | Discharge status: Home / Self Care | Payer: Medicare HMO | Source: Ambulatory Visit | Attending: Internal Medicine | Admitting: Internal Medicine

## 2022-10-16 DIAGNOSIS — Z1231 Encounter for screening mammogram for malignant neoplasm of breast: Secondary | ICD-10-CM | POA: Diagnosis present

## 2022-11-28 ENCOUNTER — Emergency Department
Admission: EM | Admit: 2022-11-28 | Discharge: 2022-11-28 | Disposition: A | Discharge status: Home / Self Care | Payer: Medicare HMO | Attending: Emergency Medicine | Admitting: Emergency Medicine

## 2022-11-28 ENCOUNTER — Emergency Department: Payer: Medicare HMO

## 2022-11-28 ENCOUNTER — Other Ambulatory Visit: Payer: Self-pay

## 2022-11-28 ENCOUNTER — Encounter: Payer: Self-pay | Admitting: Emergency Medicine

## 2022-11-28 DIAGNOSIS — I10 Essential (primary) hypertension: Secondary | ICD-10-CM | POA: Insufficient documentation

## 2022-11-28 DIAGNOSIS — R1031 Right lower quadrant pain: Secondary | ICD-10-CM | POA: Insufficient documentation

## 2022-11-28 DIAGNOSIS — M545 Low back pain, unspecified: Secondary | ICD-10-CM | POA: Diagnosis not present

## 2022-11-28 DIAGNOSIS — I159 Secondary hypertension, unspecified: Secondary | ICD-10-CM

## 2022-11-28 DIAGNOSIS — M549 Dorsalgia, unspecified: Secondary | ICD-10-CM | POA: Diagnosis present

## 2022-11-28 LAB — URINALYSIS, ROUTINE W REFLEX MICROSCOPIC
Bacteria, UA: NONE SEEN
Bilirubin Urine: NEGATIVE
Glucose, UA: >=500 mg/dL — AB
Hgb urine dipstick: NEGATIVE
Ketones, ur: 5 mg/dL — AB
Leukocytes,Ua: NEGATIVE
Nitrite: NEGATIVE
Protein, ur: NEGATIVE mg/dL
Specific Gravity, Urine: 1.02 (ref 1.005–1.030)
pH: 5 (ref 5.0–8.0)

## 2022-11-28 NOTE — Discharge Instructions (Signed)
You are seen in the emergency department for back and abdominal pain.  Your blood pressure was elevated in the emergency department.  Take your blood pressure medications as prescribed.  Decrease your salt intake.  Follow-up with your primary care physician so they can recheck your blood pressure.  You had a CT scan that showed a gallstone, do not believe that these are causing your symptoms today.  He also had findings of diverticulosis.  Alternate Motrin and Tylenol for pain control.  Follow-up with your primary care physician and return to the emergency department for any worsening symptoms.

## 2022-11-28 NOTE — ED Provider Triage Note (Signed)
Emergency Medicine Provider Triage Evaluation Note  Susan Wolfe , a 88 y.o. female  was evaluated in triage.  Pt complains of right sided back pain with radiation down to front groin area that started last week.  No history of injury.  UTI hx.  No stones.   Review of Systems  Positive:  Negative: No fever, chills, urinary sx at present.  Physical Exam  There were no vitals taken for this visit. Gen:   Awake, no distress  Ambulates without assistance.   Resp:  Normal effort Lungs clear bilat MSK:   Moves extremities without difficulty.  Tender right lateral paravetebral muscle area.  No point tenderness right rib.  Other:    Medical Decision Making  Medically screening exam initiated at 11:58 AM.  Appropriate orders placed.  Susan Wolfe was informed that the remainder of the evaluation will be completed by another provider, this initial triage assessment does not replace that evaluation, and the importance of remaining in the ED until their evaluation is complete.     Susan Hai, PA-C 11/28/22 1210

## 2022-11-28 NOTE — ED Triage Notes (Signed)
Patient arrives ambulatory by POV c/o right lower back pain radiating into right lower abdomen x 1 week. States pain worse with movement. Patients BP elevated in triage- pt states she took her meds this am.

## 2022-11-28 NOTE — ED Provider Notes (Addendum)
[facility] Provider Note    Event Date/Time   First MD Initiated Contact with Patient 11/28/22 1335     (approximate)   History   Back Pain   HPI  Susan Wolfe is a 88 y.o. female presents to the emergency department with right-sided abdominal pain/back pain.  States that she has been having back and lower abdominal pain for the past 1 week.  Denies any falls or trauma.  Nothing improves or worsens.  Taken Tylenol with some improvement.  Not worse with ambulation.  Denies any nausea or vomiting.  Does not worsen diarrhea.  No fever or chills.  Denies any dysuria, urinary urgency or frequency.  No history of kidney stones.  No urinary or bowel incontinence.  No lower extremity weakness.  No numbness of the legs.     Physical Exam   Triage Vital Signs: ED Triage Vitals  Enc Vitals Group     BP 11/28/22 1156 (!) 209/85     Pulse Rate 11/28/22 1156 78     Resp 11/28/22 1156 17     Temp 11/28/22 1156 98 F (36.7 C)     Temp src --      SpO2 11/28/22 1156 97 %     Weight 11/28/22 1202 158 lb (71.7 kg)     Height 11/28/22 1202 '5\' 3"'$  (1.6 m)     Head Circumference --      Peak Flow --      Pain Score 11/28/22 1202 7     Pain Loc --      Pain Edu? --      Excl. in [place]? --     Most recent vital signs: Vitals:   11/28/22 1156 11/28/22 1518  BP: (!) 209/85 (!) 182/72  Pulse: 78 70  Resp: 17 18  Temp: 98 F (36.7 C)   SpO2: 97% 96%    Physical Exam Constitutional:      Appearance: She is well-developed.  HENT:     Head: Atraumatic.  Eyes:     Conjunctiva/sclera: Conjunctivae normal.  Cardiovascular:     Rate and Rhythm: Regular rhythm.  Pulmonary:     Effort: No respiratory distress.  Abdominal:     General: There is no distension.     Tenderness: There is abdominal tenderness. There is right CVA tenderness.  Musculoskeletal:        General: Normal range of motion.     Cervical back: Normal range of motion.     Comments: No midline  cervical, thoracic or lumbar tenderness to palpation.  Normal sensation to the groin.  No lower extremity weakness.  Skin:    General: Skin is warm.  Neurological:     Mental Status: She is alert. Mental status is at baseline.     IMPRESSION / MDM / ASSESSMENT AND PLAN / ED COURSE  I reviewed the triage vital signs and the nursing notes.  Differential diagnosis including musculoskeletal, sciatica, acute appendicitis, intra-abdominal pathology, muscle strain, kidney stone, pyelonephritis, ACS.  No symptoms of cauda equina or epidural compression syndrome.  X-ray imaging with no acute fracture or dislocation.  UA without signs of urinary tract infection.  Low suspicion for pyelonephritis.  No blood no suspicion for kidney stone.  Will obtain CT abdomen pelvis with out contrast to further evaluate for possible etiology of the patient's pain to her right lower abdomen.  RADIOLOGY I independently reviewed imaging, my interpretation of imaging: X-ray of the thoracic spine with no  acute findings.  CT abdomen and pelvis without acute findings.  LABS (all labs ordered are listed, but only abnormal results are displayed) Labs interpreted as -  UA without signs of urinary tract infection.  No blood.  Labs Reviewed  URINALYSIS, ROUTINE W REFLEX MICROSCOPIC - Abnormal; Notable for the following components:      Result Value   Color, Urine YELLOW (*)    APPearance CLEAR (*)    Glucose, UA >=500 (*)    Ketones, ur 5 (*)    All other components within normal limits    TREATMENT   On reevaluation patient had improvement of her pain.  Able to ambulate in the emergency department without any difficulties.  States that she feels better.  Cholelithiasis but patient has not had any nausea or vomiting and no right upper quadrant abdominal pain at this time.  Patient did has an elevated blood pressure however has been eating significant amount of salt over the holiday and states that she has been eating  a [employer] ham every morning.  Encouraged to follow-up closely with her primary care physician, watch her salt intake and return to the emergency department for any worsening symptoms or ongoing symptoms.   PROCEDURES:  Critical Care performed: No  Procedures  Patient's presentation is most consistent with acute presentation with potential threat to life or bodily function.   MEDICATIONS ORDERED IN ED: Medications - No data to display  FINAL CLINICAL IMPRESSION(S) / ED DIAGNOSES   Final diagnoses:  Acute right-sided low back pain without sciatica  Secondary hypertension     Rx / DC Orders   ED Discharge Orders     None        Note:  This document was prepared using Dragon voice recognition software and may include unintentional dictation errors.   Nathaniel Man, MD 11/28/22 4970    Nathaniel Man, MD 11/28/22 Lurena Nida

## 2022-11-30 ENCOUNTER — Emergency Department: Payer: Medicare HMO

## 2022-11-30 ENCOUNTER — Observation Stay
Admission: EM | Admit: 2022-11-30 | Discharge: 2022-12-01 | Disposition: A | Discharge status: Home / Self Care | Payer: Medicare HMO | Attending: Internal Medicine | Admitting: Internal Medicine

## 2022-11-30 ENCOUNTER — Other Ambulatory Visit: Payer: Self-pay

## 2022-11-30 DIAGNOSIS — Z7982 Long term (current) use of aspirin: Secondary | ICD-10-CM | POA: Insufficient documentation

## 2022-11-30 DIAGNOSIS — Z853 Personal history of malignant neoplasm of breast: Secondary | ICD-10-CM | POA: Diagnosis not present

## 2022-11-30 DIAGNOSIS — R519 Headache, unspecified: Secondary | ICD-10-CM | POA: Insufficient documentation

## 2022-11-30 DIAGNOSIS — I10 Essential (primary) hypertension: Secondary | ICD-10-CM | POA: Diagnosis present

## 2022-11-30 DIAGNOSIS — Z79899 Other long term (current) drug therapy: Secondary | ICD-10-CM | POA: Insufficient documentation

## 2022-11-30 DIAGNOSIS — K219 Gastro-esophageal reflux disease without esophagitis: Secondary | ICD-10-CM | POA: Diagnosis not present

## 2022-11-30 DIAGNOSIS — E785 Hyperlipidemia, unspecified: Secondary | ICD-10-CM | POA: Insufficient documentation

## 2022-11-30 DIAGNOSIS — Z7984 Long term (current) use of oral hypoglycemic drugs: Secondary | ICD-10-CM | POA: Insufficient documentation

## 2022-11-30 DIAGNOSIS — I160 Hypertensive urgency: Secondary | ICD-10-CM | POA: Diagnosis not present

## 2022-11-30 DIAGNOSIS — E119 Type 2 diabetes mellitus without complications: Secondary | ICD-10-CM

## 2022-11-30 DIAGNOSIS — K8020 Calculus of gallbladder without cholecystitis without obstruction: Secondary | ICD-10-CM | POA: Diagnosis not present

## 2022-11-30 DIAGNOSIS — Z794 Long term (current) use of insulin: Secondary | ICD-10-CM

## 2022-11-30 LAB — BASIC METABOLIC PANEL
Anion gap: 5 (ref 5–15)
BUN: 14 mg/dL (ref 8–23)
CO2: 24 mmol/L (ref 22–32)
Calcium: 8.9 mg/dL (ref 8.9–10.3)
Chloride: 109 mmol/L (ref 98–111)
Creatinine, Ser: 0.61 mg/dL (ref 0.44–1.00)
GFR, Estimated: >60 mL/min (ref >60)
Glucose, Bld: 197 mg/dL — ABNORMAL HIGH (ref 70–99)
Potassium: 4.2 mmol/L (ref 3.5–5.1)
Sodium: 138 mmol/L (ref 135–145)

## 2022-11-30 LAB — CBC WITH DIFFERENTIAL/PLATELET
Abs Immature Granulocytes: 0.01 10*3/uL (ref 0.00–0.07)
Basophils Absolute: 0 10*3/uL (ref 0.0–0.1)
Basophils Relative: 1 %
Eosinophils Absolute: 0.4 10*3/uL (ref 0.0–0.5)
Eosinophils Relative: 6 %
HCT: 40.1 % (ref 36.0–46.0)
Hemoglobin: 12.6 g/dL (ref 12.0–15.0)
Immature Granulocytes: 0 %
Lymphocytes Relative: 28 %
Lymphs Abs: 1.8 10*3/uL (ref 0.7–4.0)
MCH: 29.6 pg (ref 26.0–34.0)
MCHC: 31.4 g/dL (ref 30.0–36.0)
MCV: 94.1 fL (ref 80.0–100.0)
Monocytes Absolute: 0.5 10*3/uL (ref 0.1–1.0)
Monocytes Relative: 8 %
Neutro Abs: 3.7 10*3/uL (ref 1.7–7.7)
Neutrophils Relative %: 57 %
Platelets: 217 10*3/uL (ref 150–400)
RBC: 4.26 MIL/uL (ref 3.87–5.11)
RDW: 13.3 % (ref 11.5–15.5)
WBC: 6.3 10*3/uL (ref 4.0–10.5)
nRBC: 0 % (ref 0.0–0.2)

## 2022-11-30 LAB — HEPATIC FUNCTION PANEL
ALT: 15 U/L (ref 0–44)
AST: 18 U/L (ref 15–41)
Albumin: 3.7 g/dL (ref 3.5–5.0)
Alkaline Phosphatase: 105 U/L (ref 38–126)
Bilirubin, Direct: <0.1 mg/dL (ref 0.0–0.2)
Total Bilirubin: 0.4 mg/dL (ref 0.3–1.2)
Total Protein: 7.2 g/dL (ref 6.5–8.1)

## 2022-11-30 LAB — TROPONIN I (HIGH SENSITIVITY)
Troponin I (High Sensitivity): 12 ng/L (ref <18)
Troponin I (High Sensitivity): 15 ng/L (ref <18)

## 2022-11-30 MED ORDER — ACETAMINOPHEN 325 MG PO TABS: 650.0000 mg | ORAL_TABLET | Freq: Four times a day (QID) | ORAL | Status: DC | PRN

## 2022-11-30 MED ORDER — PANTOPRAZOLE SODIUM 40 MG PO TBEC
40.0000 mg | DELAYED_RELEASE_TABLET | Freq: Every day | ORAL | Status: DC
  Administered 2022-12-01: 40 mg via ORAL
  Filled 2022-11-30: qty 1

## 2022-11-30 MED ORDER — HYDROCHLOROTHIAZIDE 25 MG PO TABS
25.0000 mg | ORAL_TABLET | ORAL | Status: AC
  Administered 2022-11-30: 25 mg via ORAL
  Filled 2022-11-30: qty 1

## 2022-11-30 MED ORDER — LABETALOL HCL 5 MG/ML IV SOLN
5.0000 mg | Freq: Once | INTRAVENOUS | Status: AC
  Administered 2022-11-30: 5 mg via INTRAVENOUS
  Filled 2022-11-30: qty 4

## 2022-11-30 MED ORDER — LABETALOL HCL 5 MG/ML IV SOLN
15.0000 mg | Freq: Once | INTRAVENOUS | Status: DC
  Filled 2022-11-30: qty 4

## 2022-11-30 MED ORDER — LINAGLIPTIN 5 MG PO TABS
5.0000 mg | ORAL_TABLET | Freq: Every day | ORAL | Status: DC
  Administered 2022-12-01: 5 mg via ORAL
  Filled 2022-11-30: qty 1

## 2022-11-30 MED ORDER — CYANOCOBALAMIN 500 MCG PO TABS
1000.0000 ug | ORAL_TABLET | Freq: Every day | ORAL | Status: DC
  Administered 2022-12-01: 1000 ug via ORAL
  Filled 2022-11-30: qty 2

## 2022-11-30 MED ORDER — TRAZODONE HCL 50 MG PO TABS: 25.0000 mg | ORAL_TABLET | Freq: Every evening | ORAL | Status: DC | PRN

## 2022-11-30 MED ORDER — LABETALOL HCL 5 MG/ML IV SOLN
10.0000 mg | INTRAVENOUS | Status: AC
  Administered 2022-11-30: 10 mg via INTRAVENOUS

## 2022-11-30 MED ORDER — HYDROCHLOROTHIAZIDE 25 MG PO TABS: 25.0000 mg | ORAL_TABLET | Freq: Every day | ORAL | Status: DC

## 2022-11-30 MED ORDER — INSULIN ASPART 100 UNIT/ML IJ SOLN: 0.0000 [IU] | Freq: Every day | INTRAMUSCULAR | Status: DC

## 2022-11-30 MED ORDER — SIMVASTATIN 10 MG PO TABS: 40.0000 mg | ORAL_TABLET | Freq: Every day | ORAL | Status: DC

## 2022-11-30 MED ORDER — ACETAMINOPHEN 325 MG RE SUPP: 650.0000 mg | Freq: Four times a day (QID) | RECTAL | Status: DC | PRN

## 2022-11-30 MED ORDER — INSULIN ASPART 100 UNIT/ML IJ SOLN
0.0000 [IU] | Freq: Three times a day (TID) | INTRAMUSCULAR | Status: DC
  Administered 2022-12-01: 5 [IU] via SUBCUTANEOUS
  Filled 2022-11-30: qty 1

## 2022-11-30 MED ORDER — ENOXAPARIN SODIUM 40 MG/0.4ML IJ SOSY
40.0000 mg | PREFILLED_SYRINGE | INTRAMUSCULAR | Status: DC
  Administered 2022-12-01: 40 mg via SUBCUTANEOUS
  Filled 2022-11-30: qty 0.4

## 2022-11-30 MED ORDER — IOHEXOL 300 MG/ML  SOLN
100.0000 mL | Freq: Once | INTRAMUSCULAR | Status: AC | PRN
  Administered 2022-11-30: 100 mL via INTRAVENOUS

## 2022-11-30 MED ORDER — LABETALOL HCL 5 MG/ML IV SOLN
10.0000 mg | INTRAVENOUS | Status: AC
  Administered 2022-11-30: 10 mg via INTRAVENOUS
  Filled 2022-11-30: qty 4

## 2022-11-30 MED ORDER — PROSIGHT PO TABS
ORAL_TABLET | Freq: Two times a day (BID) | ORAL | Status: DC
  Filled 2022-11-30: qty 1

## 2022-11-30 MED ORDER — INSULIN GLARGINE-YFGN 100 UNIT/ML ~~LOC~~ SOLN
50.0000 [IU] | Freq: Every day | SUBCUTANEOUS | Status: DC
  Administered 2022-12-01: 50 [IU] via SUBCUTANEOUS
  Filled 2022-11-30: qty 0.5

## 2022-11-30 MED ORDER — ASPIRIN 81 MG PO TBEC
81.0000 mg | DELAYED_RELEASE_TABLET | Freq: Every day | ORAL | Status: DC
  Administered 2022-12-01: 81 mg via ORAL
  Filled 2022-11-30: qty 1

## 2022-11-30 MED ORDER — MAGNESIUM HYDROXIDE 400 MG/5ML PO SUSP: 30.0000 mL | Freq: Every day | ORAL | Status: DC | PRN

## 2022-11-30 MED ORDER — DORZOLAMIDE HCL-TIMOLOL MAL 2-0.5 % OP SOLN: 1.0000 [drp] | Freq: Two times a day (BID) | OPHTHALMIC | Status: DC

## 2022-11-30 NOTE — ED Triage Notes (Signed)
Pt states she was here Thursday and was discharged and does not feel much better- pt states her BP is also elevated at 218- pt states that she is also having back pain that comes and goes

## 2022-11-30 NOTE — H&P (Signed)
[place]   PATIENT NAME: Susan Wolfe    MR#:  [id number]  DATE OF BIRTH:  Jan 20, 1928  DATE OF ADMISSION:  11/30/2022  PRIMARY CARE PHYSICIAN: Rusty Aus, MD   Patient is coming from: Home  REQUESTING/REFERRING PHYSICIAN: Conni Slipper, MD  CHIEF COMPLAINT:   Chief Complaint  Patient presents with   Hypertension    HISTORY OF PRESENT ILLNESS:  Susan Wolfe is a 88 y.o. Caucasian female with medical history significant for hypertension type II diabetes mellitus as well as breast cancer status post mastectomy, who presented to the ER with acute onset of elevated blood pressure which is actually started on 12/28 when she came to the ER however she was having right-sided low back pain that was managed and she was discharged.  This time she was also complaining of right flank pain.  She denies any headache or dizziness or blurred vision.  No chest pain or palpitations.  No dysuria, oliguria, urinary frequency or urgency.  No nausea or vomiting or diarrhea.  No bleeding diathesis.  No fever or chills. . ED Course: When she the ER, BP was 194/82 and later up to 240/99.  Vital signs were otherwise normal.  Labs revealed a blood glucose of 197 with otherwise unremarkable CMP.  CBC was within normal.  UA showed more than 500 glucose and 5 ketones 2 view chest x-ray showed no acute cardiopulmonary disease EKG as reviewed by me :  EKG showed normal sinus rhythm with a rate of 76 with a left axis deviation, right bundle branch block and Q waves inferiorly and anteroseptally. Imaging: Two-view chest x-ray showed no acute cardiopulmonary disease. Renal ultrasound showed left renal cyst with no mass or hydronephrosis.  Abdominal and pelvic CT scan showed gallstones with diverticulosis of the left colon without diverticulitis and no other acute intra-abdominal or pelvic abnormality.  This was fairly similar to a CT performed on 12/28.  The patient was given several doses of IV labetalol  that was up to 40 mg total as well as 25 mg p.o. HCTZ.  Her BP would briefly improve and then goes up rapidly again.  She will be admitted to a an observation progressive unit bed for further evaluation and management. PAST MEDICAL HISTORY:   Past Medical History:  Diagnosis Date   Breast cancer ([place]) 1993   Cancer ([place])    Left Breast CA, mastectomy. No other tx's.   Diabetes mellitus without complication (H[facility])    Metformin and Lantus   HTN (hypertension) 01/19/2021    PAST SURGICAL HISTORY:   Past Surgical History:  Procedure Laterality Date   BREAST EXCISIONAL BIOPSY Right 1990's   MASTECTOMY Left 1993    SOCIAL HISTORY:   Social History   Tobacco Use   Smoking status: Never   Smokeless tobacco: Never  Substance Use Topics   Alcohol use: Not Currently    FAMILY HISTORY:   Family History  Problem Relation Age of Onset   Breast cancer Neg Hx     DRUG ALLERGIES:   Allergies  Allergen Reactions   Penicillins Hives   Ace Inhibitors     Other reaction(s): Cough   Amlodipine Other (See Comments)    Advised by eye doctor not to take   Amoxicillin     Other reaction(s): Vomiting   Clarithromycin Hives   Lovastatin     Other reaction(s): Other (See Comments) Fatigue   Other Hives    Jergen's   Oxycodone-Acetaminophen Nausea  Only    Other reaction(s): Vomiting   Rabeprazole Diarrhea   Rosiglitazone Diarrhea   Sitagliptin Other (See Comments)   Valsartan     Other reaction(s): Angioedema    REVIEW OF SYSTEMS:   ROS As per history of present illness. All pertinent systems were reviewed above. Constitutional, HEENT, cardiovascular, respiratory, GI, GU, musculoskeletal, neuro, psychiatric, endocrine, integumentary and hematologic systems were reviewed and are otherwise negative/unremarkable except for positive findings mentioned above in the HPI.   MEDICATIONS AT HOME:   Prior to Admission medications   Medication Sig Start Date End Date Taking?  Authorizing Provider  aspirin EC 81 MG tablet Take 81 mg by mouth daily.   Yes [provider]  cyanocobalamin 1000 MCG tablet Take 1,000 mcg by mouth daily. 10/31/20  Yes [provider]  dorzolamide-timolol (COSOPT) 2-0.5 % ophthalmic solution Place 1 drop into the right eye 2 (two) times daily.   Yes [provider]  Insulin Glargine (BASAGLAR KWIKPEN) 100 UNIT/ML Inject 50 Units into the skin daily. 10/31/20  Yes [provider]  JANUVIA 100 MG tablet Take 100 mg by mouth daily. 11/05/22  Yes [provider]  metFORMIN (GLUCOPHAGE) 500 MG tablet Take 500 mg by mouth 2 (two) times daily. 10/23/20  Yes [provider]  Multiple Vitamins-Minerals (PRESERVISION AREDS 2 PO) Take 1 tablet by mouth 2 (two) times daily.   Yes [provider]  omeprazole (PRILOSEC) 40 MG capsule Take 40 mg by mouth daily. 01/09/21  Yes [provider]  simvastatin (ZOCOR) 40 MG tablet Take 1 tablet (40 mg total) by mouth at bedtime. 01/20/21 11/30/22 Yes Max Sane, MD  hydrochlorothiazide (HYDRODIURIL) 25 MG tablet Take 25 mg by mouth daily as needed. Patient not taking: Reported on 11/30/2022 10/31/20   [provider]  ondansetron (ZOFRAN) 4 MG tablet Take 1 tablet (4 mg total) by mouth every 8 (eight) hours as needed for vomiting or nausea. Patient not taking: Reported on 11/30/2022 05/02/22   Nance Pear, MD      VITAL SIGNS:  Blood pressure (!) 198/70, pulse 70, temperature 97.7 F (36.5 C), temperature source Oral, resp. rate 16, height '5\' 3"'$  (1.6 m), weight 73.5 kg, SpO2 98 %.  PHYSICAL EXAMINATION:  Physical Exam  GENERAL:  88 y.o.-year-old Caucasian female patient lying in the bed with no acute distress.  EYES: Pupils equal, round, reactive to light and accommodation. No scleral icterus. Extraocular muscles intact.  HEENT: Head atraumatic, normocephalic. Oropharynx and nasopharynx clear.  NECK:  Supple, no jugular venous  distention. No thyroid enlargement, no tenderness.  LUNGS: Normal breath sounds bilaterally, no wheezing, rales,rhonchi or crepitation. No use of accessory muscles of respiration.  CARDIOVASCULAR: Regular rate and rhythm, S1, S2 normal. No murmurs, rubs, or gallops.  ABDOMEN: Soft, nondistended, nontender. Bowel sounds present. No organomegaly or mass.  EXTREMITIES: No pedal edema, cyanosis, or clubbing.  NEUROLOGIC: Cranial nerves II through XII are intact. Muscle strength 5/5 in all extremities. Sensation intact. Gait not checked.  PSYCHIATRIC: The patient is alert and oriented x 3.  Normal affect and good eye contact. SKIN: No obvious rash, lesion, or ulcer.   LABORATORY PANEL:   CBC Recent Labs  Lab 11/30/22 1458  WBC 6.3  HGB 12.6  HCT 40.1  PLT 217   ------------------------------------------------------------------------------------------------------------------  Chemistries  Recent Labs  Lab 11/30/22 1458 11/30/22 1818  NA 138  --   K 4.2  --   CL 109  --   CO2 24  --  GLUCOSE 197*  --   BUN 14  --   CREATININE 0.61  --   CALCIUM 8.9  --   AST  --  18  ALT  --  15  ALKPHOS  --  105  BILITOT  --  0.4   ------------------------------------------------------------------------------------------------------------------  Cardiac Enzymes No results for input(s): "TROPONINI" in the last 168 hours. ------------------------------------------------------------------------------------------------------------------  RADIOLOGY:  CT ABDOMEN PELVIS W CONTRAST  Result Date: 11/30/2022 CLINICAL DATA:  Right flank pain elevated blood pressure EXAM: CT ABDOMEN AND PELVIS WITH CONTRAST TECHNIQUE: Multidetector CT imaging of the abdomen and pelvis was performed using the standard protocol following bolus administration of intravenous contrast. RADIATION DOSE REDUCTION: This exam was performed according to the departmental dose-optimization program which includes automated  exposure control, adjustment of the mA and/or kV according to patient size and/or use of iterative reconstruction technique. CONTRAST:  183m OMNIPAQUE IOHEXOL 300 MG/ML  SOLN COMPARISON:  Ultrasound 11/30/2022, CT 11/28/2022 FINDINGS: Lower chest: No acute abnormality. Hepatobiliary: Gallstone. No focal hepatic abnormality or biliary dilatation. Pancreas: Unremarkable. No pancreatic ductal dilatation or surrounding inflammatory changes. Spleen: Normal in size without focal abnormality. Adrenals/Urinary Tract: Adrenal glands are normal. No hydronephrosis. Cysts in the left kidney, no imaging follow-up is recommended. No hydronephrosis or ureteral stone. The bladder is normal Stomach/Bowel: Small hiatal hernia. No dilated small bowel. No acute bowel wall thickening. Appendix not well seen but no right lower quadrant inflammation. Diverticular disease of the left colon. Vascular/Lymphatic: Moderate aortic atherosclerosis. Common origin of the celiac and superior mesenteric arteries. No aneurysm. No suspicious lymph nodes. Reproductive: Status post hysterectomy. No adnexal masses. Other: Negative for pelvic effusion or free air. Musculoskeletal: No acute osseous abnormality. IMPRESSION: 1. No CT evidence for acute intra-abdominal or pelvic abnormality. 2. Gallstone. Diverticular disease of the left colon without acute inflammatory process. 3. Aortic atherosclerosis. Aortic Atherosclerosis (ICD10-I70.0). Electronically Signed   By: KDonavan FoilM.D.   On: 11/30/2022 19:40   [place]  Result Date: 11/30/2022 CLINICAL DATA:  Right-sided flank pain, initial encounter EXAM: RENAL / URINARY TRACT ULTRASOUND COMPLETE COMPARISON:  CT from 11/28/2022 FINDINGS: Right Kidney: Renal measurements: 9.8 x 6.2 x 6.1 cm. = volume: 194 mL. Echogenicity within normal limits. No mass or hydronephrosis visualized. Left Kidney: Renal measurements: 10.2 x 5.8 x 5.1 cm. = volume: 157 mL. No mass or hydronephrosis is noted. 2 cm simple  cyst is noted in the upper pole similar to that seen on prior CT. Bladder: Decompressed Other: None. IMPRESSION: Left renal cyst.  No follow-up is recommended. No mass lesion or hydronephrosis is noted. Electronically Signed   By: MInez CatalinaM.D.   On: 11/30/2022 19:27   DG Chest 2 View  Result Date: 11/30/2022 CLINICAL DATA:  Hypertension. EXAM: CHEST - 2 VIEW COMPARISON:  Chest two views 05/02/2022 FINDINGS: Cardiac silhouette and mediastinal contours are within limits. The lungs are clear. No pleural effusion pneumothorax. Moderate multilevel degenerative disc changes of the midthoracic spine. Left axillary surgical clips. IMPRESSION: No active cardiopulmonary disease. Electronically Signed   By: RYvonne KendallM.D.   On: 11/30/2022 16:59   CT Head Wo Contrast  Result Date: 11/30/2022 CLINICAL DATA:  88year old female with hypertension and headache. EXAM: CT HEAD WITHOUT CONTRAST TECHNIQUE: Contiguous axial images were obtained from the base of the skull through the vertex without intravenous contrast. RADIATION DOSE REDUCTION: This exam was performed according to the departmental dose-optimization program which includes automated exposure control, adjustment of the mA and/or kV according to patient  size and/or use of iterative reconstruction technique. COMPARISON:  12/25/2021 CT and prior studies FINDINGS: Brain: No evidence of acute infarction, hemorrhage, hydrocephalus, extra-axial collection or mass lesion/mass effect. Atrophy and chronic small-vessel white matter ischemic changes again noted. Vascular: Carotid and vertebral atherosclerotic calcifications are noted. Skull: No acute abnormality Sinuses/Orbits: No acute abnormality. Chronically opacified RIGHT sphenoid sinus again noted. Other: None IMPRESSION: 1. No evidence of acute intracranial abnormality. 2. Atrophy and chronic small-vessel white matter ischemic changes. 3. Chronic RIGHT sphenoid sinus disease/sinusitis. Electronically Signed    By: Margarette Canada M.D.   On: 11/30/2022 15:56      IMPRESSION AND PLAN:  Assessment and Plan: * Hypertensive urgency - The patient will be admitted to an observation progressive unit bed. - We will continue her on HCTZ and place her on as needed IV labetalol and hydralazine. - We will also add as needed p.o. clonidine. - Adequate pain management.  Cholelithiasis - Right sided flank pain could be related to her cholelithiasis with referred pain. - She has no evidence for obstructive cholelithiasis or choledocholithiasis. -She does not have any evidence of urolithiasis or any hematuria. - General surgery consult can be obtained on outpatient basis.  GERD without esophagitis - We will go continue her PPI therapy  Type 2 diabetes mellitus without complications ([place]) - The patient will be placed on supplemental coverage with NovoLog. - We will continue Januvia and hold off metformin. - We will continue her basal coverage.  Dyslipidemia - We will continue statin therapy.    DVT prophylaxis: Lovenox.  Advanced Care Planning:  Code Status: This was discussed with the patient and she is DNR/DNI. Family Communication:  The plan of care was discussed in details with the patient (and family). I answered all questions. The patient agreed to proceed with the above mentioned plan. Further management will depend upon hospital course. Disposition Plan: Back to previous home environment Consults called: none.  All the records are reviewed and case discussed with ED provider.  Status is: Observation  I certify that at the time of admission, it is my clinical judgment that the patient will require  hospital care extending less than 2 midnights.                            Dispo: The patient is from: Home              Anticipated d/c is to: Home              Patient currently is not medically stable to d/c.              Difficult to place patient: No  Christel Mormon M.D on 12/01/2022 at 1:21  AM  [facility]   From 7 PM-7 AM, contact night-coverage [email or web address]  CC: Primary care physician; Rusty Aus, MD

## 2022-11-30 NOTE — ED Provider Notes (Incomplete)
[facility] Provider Note    Event Date/Time   First MD Initiated Contact with Patient 11/30/22 1810     (approximate)   History   Hypertension   HPI  Susan Wolfe is a 88 y.o. female comes in complaining of right flank pain and very high blood pressure.  After sitting and waiting in the bed for several minutes it still very high in fact going up slightly.  The pain is in her back and it shoots into the belly and down a little but not into the legs.  It is severe at times.  Patient had a CT without contrast for this on the 28th that only showed some gallstones but no cholecystitis. Patient with no fever or vomiting or other complaints     Physical Exam   Triage Vital Signs: ED Triage Vitals  Enc Vitals Group     BP 11/30/22 1452 (!) 194/82     Pulse Rate 11/30/22 1452 84     Resp 11/30/22 1452 18     Temp 11/30/22 1452 98.1 F (36.7 C)     Temp Source 11/30/22 1452 Oral     SpO2 11/30/22 1452 94 %     Weight 11/30/22 1450 162 lb (73.5 kg)     Height 11/30/22 1450 '5\' 3"'$  (1.6 m)     Head Circumference --      Peak Flow --      Pain Score 11/30/22 1449 9     Pain Loc --      Pain Edu? --      Excl. in [place]? --     Most recent vital signs: Vitals:   11/30/22 1718 11/30/22 1755  BP: (!) 195/75 (!) 188/77  Pulse: 74 74  Resp: 20 14  Temp: 97.7 F (36.5 C)   SpO2: 98%      General: Awake, no distress.  CV:  Good peripheral perfusion.  Resp:  Normal effort. Abd:  No distention.  Soft and nontender Back: There is some tenderness to palpation and percussion in the right CVA area Extremities: Bilateral 1+ edema   ED Results / Procedures / Treatments   Labs (all labs ordered are listed, but only abnormal results are displayed) Labs Reviewed  BASIC METABOLIC PANEL - Abnormal; Notable for the following components:      Result Value   Glucose, Bld 197 (*)    All other components within normal limits  CBC WITH DIFFERENTIAL/PLATELET   HEPATIC FUNCTION PANEL  TROPONIN I (HIGH SENSITIVITY)  TROPONIN I (HIGH SENSITIVITY)     EKG  EKG read interpreted by me shows normal sinus rhythm rate of 76 left axis right bundle branch block no acute ST-T wave changes   RADIOLOGY Chest x-ray read by radiology reviewed by me shows no acute disease CT of the head read by radiology as no acute disease CT of the abdomen pelvis and ultrasound of the kidneys show no pathology.  Radiology read the films and I reviewed them. PROCEDURES:  Critical Care performed: {CriticalCareYesNo:19197::"Yes, see critical care procedure note(s)","No"}  Procedures   MEDICATIONS ORDERED IN ED: Medications  labetalol (NORMODYNE) injection 5 mg (has no administration in time range)     IMPRESSION / MDM / ASSESSMENT AND PLAN / ED COURSE  I reviewed the triage vital signs and the nursing notes.  Differential diagnosis includes, but is not limited to, ***  Patient's presentation is most consistent with {EM COPA:27473}  *** {If the patient is on the monitor, remove the brackets and asterisks on the sentence below and remember to document it as a Procedure as well. Otherwise delete the sentence below:1} {**The patient is on the cardiac monitor to evaluate for evidence of arrhythmia and/or significant heart rate changes.**} {Remember to include, when applicable, any/all of the following data: independent review of imaging independent review of labs (comment specifically on pertinent positives and negatives) review of specific prior hospitalizations, PCP/specialist notes, etc. discuss meds given and prescribed document any discussion with consultants (including hospitalists) any clinical decision tools you used and why (PECARN, NEXUS, etc.) did you consider admitting the patient? document social determinants of health affecting patient's care (homelessness, inability to follow up in a timely fashion, etc) document any  pre-existing conditions increasing risk on current visit (e.g. diabetes and HTN increasing danger of high-risk chest pain/ACS) describes what meds you gave (especially parenteral) and why any other interventions?:1}     FINAL CLINICAL IMPRESSION(S) / ED DIAGNOSES   Final diagnoses:  None     Rx / DC Orders   ED Discharge Orders     None        Note:  This document was prepared using Dragon voice recognition software and may include unintentional dictation errors.

## 2022-11-30 NOTE — ED Provider Triage Note (Signed)
Emergency Medicine Provider Triage Evaluation Note  Susan Wolfe , a 88 y.o. female  was evaluated in triage.  Pt complains of elevated blood pressure to 218/98. No headache. No vomiting, No CP/SOB. Intermittent dizziness  Review of Systems  Positive: intermittent dizziness Negative: CP/SOB  Physical Exam  There were no vitals taken for this visit. Gen:   Awake, no distress   Resp:  Normal effort  MSK:   Moves extremities without difficulty  Other:    Medical Decision Making  Medically screening exam initiated at 2:48 PM.  Appropriate orders placed.  [name] was informed that the remainder of the evaluation will be completed by another provider, this initial triage assessment does not replace that evaluation, and the importance of remaining in the ED until their evaluation is complete.     Marquette Old, PA-C 11/30/22 1454

## 2022-12-01 DIAGNOSIS — K8020 Calculus of gallbladder without cholecystitis without obstruction: Secondary | ICD-10-CM

## 2022-12-01 DIAGNOSIS — E785 Hyperlipidemia, unspecified: Secondary | ICD-10-CM | POA: Insufficient documentation

## 2022-12-01 DIAGNOSIS — E119 Type 2 diabetes mellitus without complications: Secondary | ICD-10-CM

## 2022-12-01 DIAGNOSIS — I160 Hypertensive urgency: Secondary | ICD-10-CM | POA: Diagnosis not present

## 2022-12-01 DIAGNOSIS — K219 Gastro-esophageal reflux disease without esophagitis: Secondary | ICD-10-CM | POA: Insufficient documentation

## 2022-12-01 LAB — CBC
HCT: 40 % (ref 36.0–46.0)
Hemoglobin: 12.7 g/dL (ref 12.0–15.0)
MCH: 30.1 pg (ref 26.0–34.0)
MCHC: 31.8 g/dL (ref 30.0–36.0)
MCV: 94.8 fL (ref 80.0–100.0)
Platelets: 219 10*3/uL (ref 150–400)
RBC: 4.22 MIL/uL (ref 3.87–5.11)
RDW: 13.3 % (ref 11.5–15.5)
WBC: 6.6 10*3/uL (ref 4.0–10.5)
nRBC: 0 % (ref 0.0–0.2)

## 2022-12-01 LAB — URINALYSIS, ROUTINE W REFLEX MICROSCOPIC
Bilirubin Urine: NEGATIVE
Glucose, UA: NEGATIVE mg/dL
Hgb urine dipstick: NEGATIVE
Ketones, ur: NEGATIVE mg/dL
Leukocytes,Ua: NEGATIVE
Nitrite: NEGATIVE
Protein, ur: NEGATIVE mg/dL
Specific Gravity, Urine: 1.03 (ref 1.005–1.030)
pH: 5 (ref 5.0–8.0)

## 2022-12-01 LAB — BASIC METABOLIC PANEL
Anion gap: 9 (ref 5–15)
BUN: 14 mg/dL (ref 8–23)
CO2: 28 mmol/L (ref 22–32)
Calcium: 9.2 mg/dL (ref 8.9–10.3)
Chloride: 104 mmol/L (ref 98–111)
Creatinine, Ser: 0.65 mg/dL (ref 0.44–1.00)
GFR, Estimated: >60 mL/min (ref >60)
Glucose, Bld: 194 mg/dL — ABNORMAL HIGH (ref 70–99)
Potassium: 3.6 mmol/L (ref 3.5–5.1)
Sodium: 141 mmol/L (ref 135–145)

## 2022-12-01 LAB — CBG MONITORING, ED
Glucose-Capillary: 117 mg/dL — ABNORMAL HIGH (ref 70–99)
Glucose-Capillary: 233 mg/dL — ABNORMAL HIGH (ref 70–99)

## 2022-12-01 MED ORDER — HYDRALAZINE HCL 20 MG/ML IJ SOLN: 10.0000 mg | Freq: Four times a day (QID) | INTRAMUSCULAR | Status: DC | PRN

## 2022-12-01 MED ORDER — TRIAMTERENE-HCTZ 37.5-25 MG PO TABS: 1.0000 | ORAL_TABLET | Freq: Every day | ORAL | 0 refills | Status: AC | PRN

## 2022-12-01 MED ORDER — CLONIDINE HCL ER 0.1 MG PO TB12
0.1000 mg | ORAL_TABLET | Freq: Four times a day (QID) | ORAL | Status: DC | PRN
  Administered 2022-12-01: 0.1 mg via ORAL
  Filled 2022-12-01 (×3): qty 1

## 2022-12-01 NOTE — Assessment & Plan Note (Signed)
-   We will go continue her PPI therapy

## 2022-12-01 NOTE — Discharge Summary (Signed)
Physician Discharge Summary   Patient: Susan Wolfe MRN: [id number] DOB: February 03, 1928  Admit date:     11/30/2022  Discharge date: 12/01/22  Discharge Physician: Ezekiel Slocumb   PCP: Rusty Aus, MD   Recommendations at discharge:  {Tip this will not be part of the note when signed- Example include specific recommendations for outpatient follow-up, pending tests to follow-up on. (Optional):26781}  Follow up with Primary Care in 1-2 weeks Follow up on BP control.  Pt admitted with hypertensive urgency in setting of acute pain.  Discharged with daily PRN antihypertensive medication and advised to do home BP monitoring 2-3 times per day until follow up. Repeat BMP, Mg, CBC in 1-2 weeks  Discharge Diagnoses: Principal Problem:   Hypertensive urgency Active Problems:   Cholelithiasis   Dyslipidemia   Type 2 diabetes mellitus without complications ([place])   GERD without esophagitis  Resolved Problems:   * No resolved hospital problems. [facility] Course: No notes on file  Assessment and Plan: * Hypertensive urgency - The patient will be admitted to an observation progressive unit bed. - We will continue her on HCTZ and place her on as needed IV labetalol and hydralazine. - We will also add as needed p.o. clonidine. - Adequate pain management.  Cholelithiasis - Right sided flank pain could be related to her cholelithiasis with referred pain. - She has no evidence for obstructive cholelithiasis or choledocholithiasis. -She does not have any evidence of urolithiasis or any hematuria. - General surgery consult can be obtained on outpatient basis.  GERD without esophagitis - We will go continue her PPI therapy  Type 2 diabetes mellitus without complications ([place]) - The patient will be placed on supplemental coverage with NovoLog. - We will continue Januvia and hold off metformin. - We will continue her basal coverage.  Dyslipidemia - We will continue statin  therapy.      {Tip this will not be part of the note when signed Body mass index is 28.7 kg/m. , ,  (Optional):26781}  {(NOTE) Pain control PDMP Statment (Optional):26782} Consultants: *** Procedures performed: ***  Disposition: {Plan; Disposition:26390} Diet recommendation:  Discharge Diet Orders (From admission, onward)     Start     Ordered   12/01/22 0000  Diet - low sodium heart healthy        12/01/22 1116           {Diet_Plan:26776} DISCHARGE MEDICATION: Allergies as of 12/01/2022       Reactions   Penicillins Hives   Ace Inhibitors    Other reaction(s): Cough   Amlodipine Other (See Comments)   Advised by eye doctor not to take   Amoxicillin    Other reaction(s): Vomiting   Clarithromycin Hives   Lovastatin    Other reaction(s): Other (See Comments) Fatigue   Other Hives   Jergen's   Oxycodone-acetaminophen Nausea Only   Other reaction(s): Vomiting   Rabeprazole Diarrhea   Rosiglitazone Diarrhea   Sitagliptin Other (See Comments)   Valsartan    Other reaction(s): Angioedema        Medication List     STOP taking these medications    hydrochlorothiazide 25 MG tablet Commonly known as: HYDRODIURIL   ondansetron 4 MG tablet Commonly known as: Zofran       TAKE these medications    aspirin EC 81 MG tablet Take 81 mg by mouth daily.   Basaglar KwikPen 100 UNIT/ML Inject 50 Units into the skin daily.   cyanocobalamin 1000 MCG  tablet Take 1,000 mcg by mouth daily.   dorzolamide-timolol 2-0.5 % ophthalmic solution Commonly known as: COSOPT Place 1 drop into the right eye 2 (two) times daily.   Januvia 100 MG tablet Generic drug: sitaGLIPtin Take 100 mg by mouth daily.   metFORMIN 500 MG tablet Commonly known as: GLUCOPHAGE Take 500 mg by mouth 2 (two) times daily.   omeprazole 40 MG capsule Commonly known as: PRILOSEC Take 40 mg by mouth daily.   PRESERVISION AREDS 2 PO Take 1 tablet by mouth 2 (two) times daily.    simvastatin 40 MG tablet Commonly known as: ZOCOR Take 1 tablet (40 mg total) by mouth at bedtime.   triamterene-hydrochlorothiazide 37.5-25 MG tablet Commonly known as: MAXZIDE-25 Take 1 tablet by mouth daily as needed (systolic BP > 947 or diastolic BP > 90).        Discharge Exam: Filed Weights   11/30/22 1450  Weight: 73.5 kg   ***  Condition at discharge: {DC Condition:26389}  The results of significant diagnostics from this hospitalization (including imaging, microbiology, ancillary and laboratory) are listed below for reference.   Imaging Studies: CT ABDOMEN PELVIS W CONTRAST  Result Date: 11/30/2022 CLINICAL DATA:  Right flank pain elevated blood pressure EXAM: CT ABDOMEN AND PELVIS WITH CONTRAST TECHNIQUE: Multidetector CT imaging of the abdomen and pelvis was performed using the standard protocol following bolus administration of intravenous contrast. RADIATION DOSE REDUCTION: This exam was performed according to the departmental dose-optimization program which includes automated exposure control, adjustment of the mA and/or kV according to patient size and/or use of iterative reconstruction technique. CONTRAST:  165m OMNIPAQUE IOHEXOL 300 MG/ML  SOLN COMPARISON:  Ultrasound 11/30/2022, CT 11/28/2022 FINDINGS: Lower chest: No acute abnormality. Hepatobiliary: Gallstone. No focal hepatic abnormality or biliary dilatation. Pancreas: Unremarkable. No pancreatic ductal dilatation or surrounding inflammatory changes. Spleen: Normal in size without focal abnormality. Adrenals/Urinary Tract: Adrenal glands are normal. No hydronephrosis. Cysts in the left kidney, no imaging follow-up is recommended. No hydronephrosis or ureteral stone. The bladder is normal Stomach/Bowel: Small hiatal hernia. No dilated small bowel. No acute bowel wall thickening. Appendix not well seen but no right lower quadrant inflammation. Diverticular disease of the left colon. Vascular/Lymphatic: Moderate  aortic atherosclerosis. Common origin of the celiac and superior mesenteric arteries. No aneurysm. No suspicious lymph nodes. Reproductive: Status post hysterectomy. No adnexal masses. Other: Negative for pelvic effusion or free air. Musculoskeletal: No acute osseous abnormality. IMPRESSION: 1. No CT evidence for acute intra-abdominal or pelvic abnormality. 2. Gallstone. Diverticular disease of the left colon without acute inflammatory process. 3. Aortic atherosclerosis. Aortic Atherosclerosis (ICD10-I70.0). Electronically Signed   By: KDonavan FoilM.D.   On: 11/30/2022 19:40   [place]  Result Date: 11/30/2022 CLINICAL DATA:  Right-sided flank pain, initial encounter EXAM: RENAL / URINARY TRACT ULTRASOUND COMPLETE COMPARISON:  CT from 11/28/2022 FINDINGS: Right Kidney: Renal measurements: 9.8 x 6.2 x 6.1 cm. = volume: 194 mL. Echogenicity within normal limits. No mass or hydronephrosis visualized. Left Kidney: Renal measurements: 10.2 x 5.8 x 5.1 cm. = volume: 157 mL. No mass or hydronephrosis is noted. 2 cm simple cyst is noted in the upper pole similar to that seen on prior CT. Bladder: Decompressed Other: None. IMPRESSION: Left renal cyst.  No follow-up is recommended. No mass lesion or hydronephrosis is noted. Electronically Signed   By: MInez CatalinaM.D.   On: 11/30/2022 19:27   DG Chest 2 View  Result Date: 11/30/2022 CLINICAL DATA:  Hypertension. EXAM: CHEST -  2 VIEW COMPARISON:  Chest two views 05/02/2022 FINDINGS: Cardiac silhouette and mediastinal contours are within limits. The lungs are clear. No pleural effusion pneumothorax. Moderate multilevel degenerative disc changes of the midthoracic spine. Left axillary surgical clips. IMPRESSION: No active cardiopulmonary disease. Electronically Signed   By: Yvonne Kendall M.D.   On: 11/30/2022 16:59   CT Head Wo Contrast  Result Date: 11/30/2022 CLINICAL DATA:  88 year old female with hypertension and headache. EXAM: CT HEAD WITHOUT CONTRAST  TECHNIQUE: Contiguous axial images were obtained from the base of the skull through the vertex without intravenous contrast. RADIATION DOSE REDUCTION: This exam was performed according to the departmental dose-optimization program which includes automated exposure control, adjustment of the mA and/or kV according to patient size and/or use of iterative reconstruction technique. COMPARISON:  12/25/2021 CT and prior studies FINDINGS: Brain: No evidence of acute infarction, hemorrhage, hydrocephalus, extra-axial collection or mass lesion/mass effect. Atrophy and chronic small-vessel white matter ischemic changes again noted. Vascular: Carotid and vertebral atherosclerotic calcifications are noted. Skull: No acute abnormality Sinuses/Orbits: No acute abnormality. Chronically opacified RIGHT sphenoid sinus again noted. Other: None IMPRESSION: 1. No evidence of acute intracranial abnormality. 2. Atrophy and chronic small-vessel white matter ischemic changes. 3. Chronic RIGHT sphenoid sinus disease/sinusitis. Electronically Signed   By: Margarette Canada M.D.   On: 11/30/2022 15:56   CT ABDOMEN PELVIS WO CONTRAST  Result Date: 11/28/2022 CLINICAL DATA:  Right lower quadrant abdominal pain, right lower back pain for 1 week EXAM: CT ABDOMEN AND PELVIS WITHOUT CONTRAST TECHNIQUE: Multidetector CT imaging of the abdomen and pelvis was performed following the standard protocol without IV contrast. Unenhanced CT was performed per clinician order. Lack of IV contrast limits sensitivity and specificity, especially for evaluation of abdominal/pelvic solid viscera. RADIATION DOSE REDUCTION: This exam was performed according to the departmental dose-optimization program which includes automated exposure control, adjustment of the mA and/or kV according to patient size and/or use of iterative reconstruction technique. COMPARISON:  05/02/2022 FINDINGS: Lower chest: No acute pleural or parenchymal lung disease. Stable calcification of  the mitral annulus. Prior left mastectomy. Hepatobiliary: Calcified gallstone without cholecystitis. Unremarkable unenhanced appearance of the liver. No biliary duct dilation. Pancreas: Unremarkable unenhanced appearance. Spleen: Unremarkable unenhanced appearance. Adrenals/Urinary Tract: Stable benign cortical and peripelvic renal cysts which do not require imaging follow-up. No urinary tract calculi or obstructive uropathy within either kidney. Bladder is decompressed, which limits its evaluation. The adrenals are unremarkable. Stomach/Bowel: No bowel obstruction or ileus. Scattered diverticulosis of the descending and sigmoid colon without diverticulitis. The appendix is not identified. Small hiatal hernia. Vascular/Lymphatic: Aortic atherosclerosis. No enlarged abdominal or pelvic lymph nodes. Reproductive: Status post hysterectomy. No adnexal masses. Other: No free fluid or free intraperitoneal gas. No abdominal wall hernia. Musculoskeletal: No acute or destructive bony lesions. Reconstructed images demonstrate no additional findings. IMPRESSION: 1. Cholelithiasis without cholecystitis. 2. Distal colonic diverticulosis without diverticulitis. 3. Small hiatal hernia. 4.  Aortic Atherosclerosis (ICD10-I70.0). Electronically Signed   By: Randa Ngo M.D.   On: 11/28/2022 15:01   DG Thoracic Spine 2 View  Result Date: 11/28/2022 CLINICAL DATA:  Right-sided thoracic back pain for 1 week. No known injury. EXAM: THORACIC SPINE 2 VIEWS COMPARISON:  None Available. FINDINGS: There is no evidence of thoracic spine fracture. Alignment is normal. Mild-to-moderate degenerative disc disease is seen at multiple levels in the mid and lower thoracic spine. Mild thoracic levoscoliosis also noted. IMPRESSION: No acute findings. Thoracic spine degenerative disc disease and levoscoliosis. Electronically Signed   By: Jenny Reichmann  Tereso Newcomer M.D.   On: 11/28/2022 13:09    Microbiology: Results for orders placed or performed during  the hospital encounter of 01/19/21  SARS CORONAVIRUS 2 (TAT 6-24 HRS) Nasopharyngeal Nasopharyngeal Swab     Status: None   Collection Time: 01/19/21 10:14 PM   Specimen: Nasopharyngeal Swab  Result Value Ref Range Status   SARS Coronavirus 2 NEGATIVE NEGATIVE Final    Comment: (NOTE) SARS-CoV-2 target nucleic acids are NOT DETECTED.  The SARS-CoV-2 RNA is generally detectable in upper and lower respiratory specimens during the acute phase of infection. Negative results do not preclude SARS-CoV-2 infection, do not rule out co-infections with other pathogens, and should not be used as the sole basis for treatment or other patient management decisions. Negative results must be combined with clinical observations, patient history, and epidemiological information. The expected result is Negative.  Fact Sheet for Patients: SugarRoll.be  Fact Sheet for Healthcare Providers: https://www.woods-mathews.com/  This test is not yet approved or cleared by the [place] FDA and  has been authorized for detection and/or diagnosis of SARS-CoV-2 by FDA under an Emergency Use Authorization (EUA). This EUA will remain  in effect (meaning this test can be used) for the duration of the COVID-19 declaration under Se ction 564(b)(1) of the Act, 21 U.S.C. section 360bbb-3(b)(1), unless the authorization is terminated or revoked sooner.  Performed at [facility], [place] [address], [place], [place] [address]     Labs: CBC: Recent Labs  Lab 11/30/22 1458 12/01/22 0303  WBC 6.3 6.6  NEUTROABS 3.7  --   HGB 12.6 12.7  HCT 40.1 40.0  MCV 94.1 94.8  PLT 217 124   Basic Metabolic Panel: Recent Labs  Lab 11/30/22 1458 12/01/22 0303  NA 138 141  K 4.2 3.6  CL 109 104  CO2 24 28  GLUCOSE 197* 194*  BUN 14 14  CREATININE 0.61 0.65  CALCIUM 8.9 9.2   Liver Function Tests: Recent Labs  Lab 11/30/22 1818  AST 18  ALT 15  ALKPHOS 105   BILITOT 0.4  PROT 7.2  ALBUMIN 3.7   CBG: Recent Labs  Lab 12/01/22 0003 12/01/22 0752  GLUCAP 117* 233*    Discharge time spent: {LESS THAN/GREATER THAN:26388} 30 minutes.  Signed: Ezekiel Slocumb, DO [facility] 12/01/2022

## 2022-12-01 NOTE — Assessment & Plan Note (Signed)
-   The patient will be placed on supplemental coverage with NovoLog. - We will continue Januvia and hold off metformin. - We will continue her basal coverage.

## 2022-12-01 NOTE — Assessment & Plan Note (Addendum)
-   Right sided flank pain could be related to her cholelithiasis with referred pain. - She has no evidence for obstructive cholelithiasis or choledocholithiasis. -She does not have any evidence of urolithiasis or any hematuria. - General surgery consult can be obtained on outpatient basis.

## 2022-12-01 NOTE — Assessment & Plan Note (Signed)
-   The patient will be admitted to an observation progressive unit bed. - We will continue her on HCTZ and place her on as needed IV labetalol and hydralazine. - We will also add as needed p.o. clonidine. - Adequate pain management.

## 2022-12-01 NOTE — Assessment & Plan Note (Signed)
-   We will continue statin therapy. 

## 2022-12-03 LAB — HEMOGLOBIN A1C
Hgb A1c MFr Bld: 7.5 % — ABNORMAL HIGH (ref 4.8–5.6)
Mean Plasma Glucose: 169 mg/dL

## 2022-12-11 ENCOUNTER — Other Ambulatory Visit: Payer: Self-pay | Admitting: Internal Medicine

## 2022-12-11 DIAGNOSIS — S32020A Wedge compression fracture of second lumbar vertebra, initial encounter for closed fracture: Secondary | ICD-10-CM

## 2022-12-12 ENCOUNTER — Ambulatory Visit
Admission: RE | Admit: 2022-12-12 | Discharge: 2022-12-12 | Disposition: A | Discharge status: Home / Self Care | Payer: Medicare HMO | Source: Ambulatory Visit | Attending: Internal Medicine | Admitting: Internal Medicine

## 2022-12-12 DIAGNOSIS — S32020A Wedge compression fracture of second lumbar vertebra, initial encounter for closed fracture: Secondary | ICD-10-CM

## 2023-02-16 ENCOUNTER — Emergency Department: Payer: Medicare HMO

## 2023-02-16 ENCOUNTER — Other Ambulatory Visit: Payer: Self-pay

## 2023-02-16 ENCOUNTER — Emergency Department
Admission: EM | Admit: 2023-02-16 | Discharge: 2023-02-16 | Disposition: A | Discharge status: Home / Self Care | Payer: Medicare HMO | Attending: Emergency Medicine | Admitting: Emergency Medicine

## 2023-02-16 DIAGNOSIS — R109 Unspecified abdominal pain: Secondary | ICD-10-CM | POA: Insufficient documentation

## 2023-02-16 DIAGNOSIS — Z853 Personal history of malignant neoplasm of breast: Secondary | ICD-10-CM | POA: Diagnosis not present

## 2023-02-16 DIAGNOSIS — N390 Urinary tract infection, site not specified: Secondary | ICD-10-CM | POA: Diagnosis not present

## 2023-02-16 DIAGNOSIS — E119 Type 2 diabetes mellitus without complications: Secondary | ICD-10-CM | POA: Diagnosis not present

## 2023-02-16 DIAGNOSIS — I10 Essential (primary) hypertension: Secondary | ICD-10-CM | POA: Insufficient documentation

## 2023-02-16 DIAGNOSIS — R300 Dysuria: Secondary | ICD-10-CM | POA: Diagnosis present

## 2023-02-16 LAB — COMPREHENSIVE METABOLIC PANEL
ALT: 14 U/L (ref 0–44)
AST: 19 U/L (ref 15–41)
Albumin: 3.3 g/dL — ABNORMAL LOW (ref 3.5–5.0)
Alkaline Phosphatase: 69 U/L (ref 38–126)
Anion gap: 7 (ref 5–15)
BUN: 19 mg/dL (ref 8–23)
CO2: 22 mmol/L (ref 22–32)
Calcium: 8.6 mg/dL — ABNORMAL LOW (ref 8.9–10.3)
Chloride: 108 mmol/L (ref 98–111)
Creatinine, Ser: 0.65 mg/dL (ref 0.44–1.00)
GFR, Estimated: >60 mL/min (ref >60)
Glucose, Bld: 118 mg/dL — ABNORMAL HIGH (ref 70–99)
Potassium: 4 mmol/L (ref 3.5–5.1)
Sodium: 137 mmol/L (ref 135–145)
Total Bilirubin: 0.6 mg/dL (ref 0.3–1.2)
Total Protein: 6.6 g/dL (ref 6.5–8.1)

## 2023-02-16 LAB — CBC
HCT: 38.4 % (ref 36.0–46.0)
Hemoglobin: 12.2 g/dL (ref 12.0–15.0)
MCH: 31 pg (ref 26.0–34.0)
MCHC: 31.8 g/dL (ref 30.0–36.0)
MCV: 97.5 fL (ref 80.0–100.0)
Platelets: 187 10*3/uL (ref 150–400)
RBC: 3.94 MIL/uL (ref 3.87–5.11)
RDW: 14.4 % (ref 11.5–15.5)
WBC: 5.9 10*3/uL (ref 4.0–10.5)
nRBC: 0 % (ref 0.0–0.2)

## 2023-02-16 LAB — URINALYSIS, ROUTINE W REFLEX MICROSCOPIC
Bacteria, UA: NONE SEEN
Bilirubin Urine: NEGATIVE
Glucose, UA: NEGATIVE mg/dL
Ketones, ur: 5 mg/dL — AB
Nitrite: NEGATIVE
Protein, ur: 100 mg/dL — AB
RBC / HPF: >50 RBC/hpf (ref 0–5)
Specific Gravity, Urine: 1.023 (ref 1.005–1.030)
WBC, UA: >50 WBC/hpf (ref 0–5)
pH: 5 (ref 5.0–8.0)

## 2023-02-16 LAB — LIPASE, BLOOD: Lipase: 31 U/L (ref 11–51)

## 2023-02-16 MED ORDER — SULFAMETHOXAZOLE-TRIMETHOPRIM 800-160 MG PO TABS
1.0000 | ORAL_TABLET | Freq: Once | ORAL | Status: AC
  Administered 2023-02-16: 1 via ORAL
  Filled 2023-02-16: qty 1

## 2023-02-16 MED ORDER — IOHEXOL 300 MG/ML  SOLN
100.0000 mL | Freq: Once | INTRAMUSCULAR | Status: AC | PRN
  Administered 2023-02-16: 100 mL via INTRAVENOUS

## 2023-02-16 MED ORDER — SULFAMETHOXAZOLE-TRIMETHOPRIM 800-160 MG PO TABS: 1.0000 | ORAL_TABLET | Freq: Two times a day (BID) | ORAL | 0 refills | Status: AC

## 2023-02-16 NOTE — Discharge Instructions (Signed)
Take antibiotics for the full 5 days as prescribed.  See your doctor this week for a follow-up appointment.  Thank you for choosing [place] for your health care today!  Please see your primary doctor this week for a follow up appointment.   Sometimes, in the early stages of certain disease courses it is difficult to detect in the emergency department evaluation -- so, it is important that you continue to monitor your symptoms and call your doctor right away or return to the emergency department if you develop any new or worsening symptoms.  Please go to the following website to schedule new (and existing) patient appointments:   [email or web address]  If you do not have a primary doctor try calling the following clinics to establish care:  If you have insurance:  [facility] [phone] [place] [address]   [facility]  [phone] [place] [place] [address]   If you do not have insurance:  [facility]  [phone] [address], [place] [address]   The following is another list of primary care offices in the area who are accepting new patients at this time.  Please reach out to one of them directly and let them know you would like to schedule an appointment to follow up on an Emergency Department visit, and/or to establish a new primary care provider (PCP).  There are likely other primary care clinics in the are who are accepting new patients, but this is an excellent place to start:  [place] physician: Dr Lavon Paganini [address] [place], [place] [address] [phone]  [facility] Lead Physician: Dr Steele Sizer [address], [place], [place] [address] [phone]  [place] Physician: Dr Park Liter [address] [place], [place] [address] [phone]  [facility] Lead  Physician: Dr Dewaine Oats [place], [place], [place] [address] [phone]  [facility] Physician: Dr Halina Maidens [address] Colin Broach [place], [place] [address] [phone]   It was my pleasure to care for you today.   Hoover Brunette Jacelyn Grip, MD

## 2023-02-16 NOTE — ED Triage Notes (Signed)
Pt reports lower abdominal pain for the past week Pt reports she called her PCP and never received a call back. Pt reports tonight pain and urinary symptoms increased. Pt talks in complete sentences no respiratory distress noted

## 2023-02-16 NOTE — ED Provider Notes (Signed)
[facility] Provider Note    Event Date/Time   First MD Initiated Contact with Patient 02/16/23 0703     (approximate)   History   Abdominal Pain   HPI  Susan Wolfe is a 89 y.o. female   Past medical history of remote breast cancer, diabetes, hypertension who presents emergency department with suprapubic discomfort and dysuria for the past week.  No systemic symptoms like fevers, chills, back pain.  No other acute medical complaints.  She thinks she has urinary tract infection called her doctor but was unable to get an appointment at end of the week.  Independent Historian contributed to assessment above: Her husband who is at bedside  External Medical Documents Reviewed: Discharge summary from December 2023 when she was admitted for hypertension      Physical Exam   Triage Vital Signs: ED Triage Vitals  Enc Vitals Group     BP 02/16/23 0257 139/73     Pulse Rate 02/16/23 0257 97     Resp 02/16/23 0257 16     Temp 02/16/23 0257 97.7 F (36.5 C)     Temp Source 02/16/23 0257 Oral     SpO2 02/16/23 0257 95 %     Weight 02/16/23 0301 155 lb (70.3 kg)     Height 02/16/23 0301 5\' 3"  (1.6 m)     Head Circumference --      Peak Flow --      Pain Score 02/16/23 0300 9     Pain Loc --      Pain Edu? --      Excl. in [place]? --     Most recent vital signs: Vitals:   02/16/23 0257 02/16/23 0707  BP: 139/73 (!) 126/59  Pulse: 97 81  Resp: 16 20  Temp: 97.7 F (36.5 C) 97.6 F (36.4 C)  SpO2: 95% 98%    General: Awake, no distress.  CV:  Good peripheral perfusion.  Resp:  Normal effort.  Abd:  No distention.  Other:  Awake comfortable nontoxic vital signs normal afebrile and mild suprapubic tenderness to palpation.  No CVA tenderness.   ED Results / Procedures / Treatments   Labs (all labs ordered are listed, but only abnormal results are displayed) Labs Reviewed  COMPREHENSIVE METABOLIC PANEL - Abnormal; Notable for the following  components:      Result Value   Glucose, Bld 118 (*)    Calcium 8.6 (*)    Albumin 3.3 (*)    All other components within normal limits  URINALYSIS, ROUTINE W REFLEX MICROSCOPIC - Abnormal; Notable for the following components:   Color, Urine YELLOW (*)    APPearance TURBID (*)    Hgb urine dipstick SMALL (*)    Ketones, ur 5 (*)    Protein, ur 100 (*)    Leukocytes,Ua LARGE (*)    All other components within normal limits  URINE CULTURE  LIPASE, BLOOD  CBC     I ordered and reviewed the above labs they are notable for normal white blood cell count, leukocytes in the urine    RADIOLOGY I independently reviewed and interpreted CT of the abdomen pelvis and see no obvious obstructive or inflammatory changes   PROCEDURES:  Critical Care performed: No  Procedures   MEDICATIONS ORDERED IN ED: Medications  sulfamethoxazole-trimethoprim (BACTRIM DS) 800-160 MG per tablet 1 tablet (has no administration in time range)  iohexol (OMNIPAQUE) 300 MG/ML solution 100 mL (100 mLs Intravenous Contrast Given 02/16/23 0420)  IMPRESSION / MDM / ASSESSMENT AND PLAN / ED COURSE  I reviewed the triage vital signs and the nursing notes.                                Patient's presentation is most consistent with acute presentation with potential threat to life or bodily function.  Differential diagnosis includes, but is not limited to, urinary tract infection, pyelonephritis, appendicitis, cholelithiasis/cholecystitis   The patient is on the cardiac monitor to evaluate for evidence of arrhythmia and/or significant heart rate changes.  MDM: Patient with abdominal tenderness in the lower portions with urinary symptoms dysuria over the last week.  Check urinalysis for urinary tract infection.  CT scan of the abdomen pelvis given tenderness to palpation check for surgical pathologies like appendicitis.  Cystitis changes on CT scan correlate with leukocytes in the urine, however with  no bacteria in the urine, send urine culture.  Treat with Bactrim antibiotic.   I considered hospitalization for admission or observation however given overall well appearance normal vital signs and plan for outpatient treatment with oral antibiotics and follow-up, I think outpatient monitoring treatment and follow-up most appropriate at this time.        FINAL CLINICAL IMPRESSION(S) / ED DIAGNOSES   Final diagnoses:  Lower urinary tract infectious disease     Rx / DC Orders   ED Discharge Orders          Ordered    sulfamethoxazole-trimethoprim (BACTRIM DS) 800-160 MG tablet  2 times daily        02/16/23 0731             Note:  This document was prepared using Dragon voice recognition software and may include unintentional dictation errors.    Lucillie Garfinkel, MD 02/16/23 [phone]

## 2023-02-17 LAB — URINE CULTURE: Culture: <10000 — AB

## 2023-05-21 DIAGNOSIS — R1084 Generalized abdominal pain: Secondary | ICD-10-CM | POA: Insufficient documentation

## 2023-05-21 DIAGNOSIS — I160 Hypertensive urgency: Secondary | ICD-10-CM | POA: Diagnosis not present

## 2023-05-21 DIAGNOSIS — K5289 Other specified noninfective gastroenteritis and colitis: Secondary | ICD-10-CM | POA: Insufficient documentation

## 2023-05-21 DIAGNOSIS — K5900 Constipation, unspecified: Principal | ICD-10-CM | POA: Insufficient documentation

## 2023-05-21 DIAGNOSIS — Z79899 Other long term (current) drug therapy: Secondary | ICD-10-CM | POA: Diagnosis not present

## 2023-05-21 DIAGNOSIS — Z7982 Long term (current) use of aspirin: Secondary | ICD-10-CM | POA: Insufficient documentation

## 2023-05-21 DIAGNOSIS — I10 Essential (primary) hypertension: Secondary | ICD-10-CM | POA: Insufficient documentation

## 2023-05-21 DIAGNOSIS — Z853 Personal history of malignant neoplasm of breast: Secondary | ICD-10-CM | POA: Insufficient documentation

## 2023-05-21 DIAGNOSIS — Z7984 Long term (current) use of oral hypoglycemic drugs: Secondary | ICD-10-CM | POA: Diagnosis not present

## 2023-05-21 DIAGNOSIS — E1165 Type 2 diabetes mellitus with hyperglycemia: Secondary | ICD-10-CM | POA: Insufficient documentation

## 2023-05-22 ENCOUNTER — Other Ambulatory Visit: Payer: Self-pay

## 2023-05-22 ENCOUNTER — Observation Stay
Admission: EM | Admit: 2023-05-22 | Discharge: 2023-05-23 | Disposition: A | Discharge status: Home / Self Care | Payer: Medicare HMO | Attending: Internal Medicine | Admitting: Internal Medicine

## 2023-05-22 ENCOUNTER — Encounter: Payer: Self-pay | Admitting: Emergency Medicine

## 2023-05-22 ENCOUNTER — Emergency Department: Payer: Medicare HMO

## 2023-05-22 DIAGNOSIS — R1084 Generalized abdominal pain: Secondary | ICD-10-CM

## 2023-05-22 DIAGNOSIS — E785 Hyperlipidemia, unspecified: Secondary | ICD-10-CM | POA: Diagnosis present

## 2023-05-22 DIAGNOSIS — K5900 Constipation, unspecified: Secondary | ICD-10-CM

## 2023-05-22 DIAGNOSIS — E119 Type 2 diabetes mellitus without complications: Secondary | ICD-10-CM

## 2023-05-22 DIAGNOSIS — K5289 Other specified noninfective gastroenteritis and colitis: Secondary | ICD-10-CM

## 2023-05-22 DIAGNOSIS — I160 Hypertensive urgency: Secondary | ICD-10-CM | POA: Diagnosis present

## 2023-05-22 LAB — CBC WITH DIFFERENTIAL/PLATELET
Abs Immature Granulocytes: 0.01 10*3/uL (ref 0.00–0.07)
Basophils Absolute: 0 10*3/uL (ref 0.0–0.1)
Basophils Relative: 0 %
Eosinophils Absolute: 0.2 10*3/uL (ref 0.0–0.5)
Eosinophils Relative: 3 %
HCT: 40.1 % (ref 36.0–46.0)
Hemoglobin: 12.8 g/dL (ref 12.0–15.0)
Immature Granulocytes: 0 %
Lymphocytes Relative: 18 %
Lymphs Abs: 1.2 10*3/uL (ref 0.7–4.0)
MCH: 31.1 pg (ref 26.0–34.0)
MCHC: 31.9 g/dL (ref 30.0–36.0)
MCV: 97.3 fL (ref 80.0–100.0)
Monocytes Absolute: 0.5 10*3/uL (ref 0.1–1.0)
Monocytes Relative: 7 %
Neutro Abs: 5.1 10*3/uL (ref 1.7–7.7)
Neutrophils Relative %: 72 %
Platelets: 188 10*3/uL (ref 150–400)
RBC: 4.12 MIL/uL (ref 3.87–5.11)
RDW: 12.3 % (ref 11.5–15.5)
WBC: 7 10*3/uL (ref 4.0–10.5)
nRBC: 0 % (ref 0.0–0.2)

## 2023-05-22 LAB — COMPREHENSIVE METABOLIC PANEL
ALT: 15 U/L (ref 0–44)
AST: 17 U/L (ref 15–41)
Albumin: 3.7 g/dL (ref 3.5–5.0)
Alkaline Phosphatase: 90 U/L (ref 38–126)
Anion gap: 9 (ref 5–15)
BUN: 20 mg/dL (ref 8–23)
CO2: 24 mmol/L (ref 22–32)
Calcium: 8.5 mg/dL — ABNORMAL LOW (ref 8.9–10.3)
Chloride: 105 mmol/L (ref 98–111)
Creatinine, Ser: 0.65 mg/dL (ref 0.44–1.00)
GFR, Estimated: >60 mL/min (ref >60)
Glucose, Bld: 199 mg/dL — ABNORMAL HIGH (ref 70–99)
Potassium: 3.9 mmol/L (ref 3.5–5.1)
Sodium: 138 mmol/L (ref 135–145)
Total Bilirubin: 0.7 mg/dL (ref 0.3–1.2)
Total Protein: 6.8 g/dL (ref 6.5–8.1)

## 2023-05-22 LAB — GLUCOSE, CAPILLARY
Glucose-Capillary: 306 mg/dL — ABNORMAL HIGH (ref 70–99)
Glucose-Capillary: 238 mg/dL — ABNORMAL HIGH (ref 70–99)

## 2023-05-22 LAB — URINALYSIS, ROUTINE W REFLEX MICROSCOPIC
Bilirubin Urine: NEGATIVE
Glucose, UA: 50 mg/dL — AB
Hgb urine dipstick: NEGATIVE
Ketones, ur: NEGATIVE mg/dL
Leukocytes,Ua: NEGATIVE
Nitrite: NEGATIVE
Protein, ur: NEGATIVE mg/dL
Specific Gravity, Urine: 1.024 (ref 1.005–1.030)
pH: 8 (ref 5.0–8.0)

## 2023-05-22 LAB — TROPONIN I (HIGH SENSITIVITY): Troponin I (High Sensitivity): 4 ng/L (ref <18)

## 2023-05-22 LAB — HEMOGLOBIN A1C
Hgb A1c MFr Bld: 6.1 % — ABNORMAL HIGH (ref 4.8–5.6)
Mean Plasma Glucose: 128.37 mg/dL

## 2023-05-22 LAB — CBG MONITORING, ED
Glucose-Capillary: 199 mg/dL — ABNORMAL HIGH (ref 70–99)
Glucose-Capillary: 312 mg/dL — ABNORMAL HIGH (ref 70–99)

## 2023-05-22 LAB — LIPASE, BLOOD: Lipase: 23 U/L (ref 11–51)

## 2023-05-22 MED ORDER — IOHEXOL 300 MG/ML  SOLN
100.0000 mL | Freq: Once | INTRAMUSCULAR | Status: AC | PRN
  Administered 2023-05-22: 100 mL via INTRAVENOUS

## 2023-05-22 MED ORDER — HYDRALAZINE HCL 20 MG/ML IJ SOLN: 5.0000 mg | Freq: Four times a day (QID) | INTRAMUSCULAR | Status: DC | PRN

## 2023-05-22 MED ORDER — INSULIN GLARGINE-YFGN 100 UNIT/ML ~~LOC~~ SOLN
30.0000 [IU] | Freq: Every day | SUBCUTANEOUS | Status: DC
  Administered 2023-05-22: 30 [IU] via SUBCUTANEOUS
  Filled 2023-05-22: qty 0.3

## 2023-05-22 MED ORDER — ASPIRIN 81 MG PO TBEC
81.0000 mg | DELAYED_RELEASE_TABLET | Freq: Every day | ORAL | Status: DC
  Administered 2023-05-22 – 2023-05-23 (×2): 81 mg via ORAL
  Filled 2023-05-22 (×2): qty 1

## 2023-05-22 MED ORDER — METRONIDAZOLE 500 MG/100ML IV SOLN
500.0000 mg | Freq: Once | INTRAVENOUS | Status: AC
  Administered 2023-05-22: 500 mg via INTRAVENOUS
  Filled 2023-05-22: qty 100

## 2023-05-22 MED ORDER — MAGNESIUM CITRATE PO SOLN
1.0000 | Freq: Once | ORAL | Status: AC
  Administered 2023-05-22: 1 via ORAL
  Filled 2023-05-22: qty 296

## 2023-05-22 MED ORDER — INSULIN ASPART 100 UNIT/ML IJ SOLN
0.0000 [IU] | Freq: Every day | INTRAMUSCULAR | Status: DC
  Administered 2023-05-22: 2 [IU] via SUBCUTANEOUS
  Filled 2023-05-22: qty 1

## 2023-05-22 MED ORDER — PANTOPRAZOLE SODIUM 40 MG PO TBEC
40.0000 mg | DELAYED_RELEASE_TABLET | Freq: Every day | ORAL | Status: DC
  Administered 2023-05-22 – 2023-05-23 (×2): 40 mg via ORAL
  Filled 2023-05-22 (×2): qty 1

## 2023-05-22 MED ORDER — ONDANSETRON HCL 4 MG/2ML IJ SOLN: 4.0000 mg | Freq: Four times a day (QID) | INTRAMUSCULAR | Status: DC | PRN

## 2023-05-22 MED ORDER — POLYETHYLENE GLYCOL 3350 17 G PO PACK: 17.0000 g | PACK | Freq: Every day | ORAL | Status: DC

## 2023-05-22 MED ORDER — POLYETHYLENE GLYCOL 3350 17 G PO PACK
17.0000 g | PACK | Freq: Every day | ORAL | Status: DC
  Administered 2023-05-23: 17 g via ORAL
  Filled 2023-05-22: qty 1

## 2023-05-22 MED ORDER — LIDOCAINE HCL URETHRAL/MUCOSAL 2 % EX GEL
1.0000 | Freq: Once | CUTANEOUS | Status: AC
  Administered 2023-05-22: 1 via TOPICAL
  Filled 2023-05-22: qty 10

## 2023-05-22 MED ORDER — ONDANSETRON HCL 4 MG PO TABS: 4.0000 mg | ORAL_TABLET | Freq: Four times a day (QID) | ORAL | Status: DC | PRN

## 2023-05-22 MED ORDER — SENNOSIDES-DOCUSATE SODIUM 8.6-50 MG PO TABS
1.0000 | ORAL_TABLET | Freq: Two times a day (BID) | ORAL | Status: DC
  Administered 2023-05-22 – 2023-05-23 (×2): 1 via ORAL
  Filled 2023-05-22 (×2): qty 1

## 2023-05-22 MED ORDER — DOCUSATE SODIUM 50 MG/5ML PO LIQD
100.0000 mg | Freq: Once | ORAL | Status: AC
  Administered 2023-05-22: 100 mg
  Filled 2023-05-22: qty 10

## 2023-05-22 MED ORDER — DORZOLAMIDE HCL-TIMOLOL MAL 2-0.5 % OP SOLN
1.0000 [drp] | Freq: Two times a day (BID) | OPHTHALMIC | Status: DC
  Administered 2023-05-22 – 2023-05-23 (×2): 1 [drp] via OPHTHALMIC
  Filled 2023-05-22 (×2): qty 10

## 2023-05-22 MED ORDER — TRIAMTERENE-HCTZ 37.5-25 MG PO TABS: 1.0000 | ORAL_TABLET | Freq: Every day | ORAL | Status: DC | PRN

## 2023-05-22 MED ORDER — SODIUM CHLORIDE 0.9 % IV BOLUS
500.0000 mL | Freq: Once | INTRAVENOUS | Status: AC
  Administered 2023-05-22: 500 mL via INTRAVENOUS

## 2023-05-22 MED ORDER — LACTULOSE 10 GM/15ML PO SOLN
30.0000 g | Freq: Once | ORAL | Status: AC
  Administered 2023-05-22: 30 g via ORAL
  Filled 2023-05-22: qty 60

## 2023-05-22 MED ORDER — SIMVASTATIN 20 MG PO TABS
40.0000 mg | ORAL_TABLET | Freq: Every day | ORAL | Status: DC
  Administered 2023-05-22: 40 mg via ORAL
  Filled 2023-05-22: qty 2

## 2023-05-22 MED ORDER — ONDANSETRON HCL 4 MG/2ML IJ SOLN: 4.0000 mg | Freq: Once | INTRAMUSCULAR | Status: DC

## 2023-05-22 MED ORDER — ENOXAPARIN SODIUM 40 MG/0.4ML IJ SOSY
40.0000 mg | PREFILLED_SYRINGE | INTRAMUSCULAR | Status: DC
  Administered 2023-05-23: 40 mg via SUBCUTANEOUS
  Filled 2023-05-22 (×2): qty 0.4

## 2023-05-22 MED ORDER — ACETAMINOPHEN 325 MG PO TABS: 650.0000 mg | ORAL_TABLET | Freq: Four times a day (QID) | ORAL | Status: DC | PRN

## 2023-05-22 MED ORDER — BISACODYL 5 MG PO TBEC: 5.0000 mg | DELAYED_RELEASE_TABLET | Freq: Every day | ORAL | Status: DC | PRN

## 2023-05-22 MED ORDER — INSULIN ASPART 100 UNIT/ML IJ SOLN
0.0000 [IU] | Freq: Three times a day (TID) | INTRAMUSCULAR | Status: DC
  Administered 2023-05-22: 11 [IU] via SUBCUTANEOUS
  Administered 2023-05-22: 3 [IU] via SUBCUTANEOUS
  Administered 2023-05-22: 11 [IU] via SUBCUTANEOUS
  Filled 2023-05-22 (×4): qty 1

## 2023-05-22 MED ORDER — POLYETHYLENE GLYCOL 3350 17 GM/SCOOP PO POWD
1.0000 | Freq: Once | ORAL | Status: AC
  Administered 2023-05-22: 255 g via ORAL
  Filled 2023-05-22: qty 255

## 2023-05-22 MED ORDER — DOXAZOSIN MESYLATE 2 MG PO TABS
2.0000 mg | ORAL_TABLET | Freq: Every day | ORAL | Status: DC
  Administered 2023-05-22 – 2023-05-23 (×2): 2 mg via ORAL
  Filled 2023-05-22 (×3): qty 1

## 2023-05-22 MED ORDER — DIAZEPAM 5 MG/ML IJ SOLN
1.0000 mg | Freq: Once | INTRAMUSCULAR | Status: AC
  Administered 2023-05-22: 1 mg via INTRAVENOUS
  Filled 2023-05-22: qty 2

## 2023-05-22 MED ORDER — ACETAMINOPHEN 650 MG RE SUPP: 650.0000 mg | Freq: Four times a day (QID) | RECTAL | Status: DC | PRN

## 2023-05-22 MED ORDER — BISACODYL 10 MG RE SUPP
10.0000 mg | Freq: Every day | RECTAL | Status: DC
  Administered 2023-05-22: 10 mg via RECTAL
  Filled 2023-05-22: qty 1

## 2023-05-22 MED ORDER — VITAMIN B-12 1000 MCG PO TABS
1000.0000 ug | ORAL_TABLET | Freq: Every day | ORAL | Status: DC
  Administered 2023-05-22 – 2023-05-23 (×2): 1000 ug via ORAL
  Filled 2023-05-22 (×2): qty 1

## 2023-05-22 MED ORDER — FLEET ENEMA 7-19 GM/118ML RE ENEM: 1.0000 | ENEMA | Freq: Once | RECTAL | Status: DC

## 2023-05-22 NOTE — ED Provider Notes (Signed)
[facility] Provider Note    Event Date/Time   First MD Initiated Contact with Patient 05/22/23 0209     (approximate)   History   Constipation   HPI  Susan Wolfe is a 89 y.o. female who presents to the ED from home with a chief complaint of abdominal pain and constipation.  Patient reports no BM in over 1 week, almost 2 weeks.  Usually has a daily BM.  Recent addition of a new blood pressure medication some weeks ago.  Otherwise has not changed her medications, diet nor has she traveled.  Has tried suppositories, laxatives, stool softeners and enemas at home without success.  Denies fever/chills, chest pain, shortness of breath, nausea, vomiting, urinary retention or diarrhea.     Past Medical History   Past Medical History:  Diagnosis Date   Breast cancer (HCC) 1993   Cancer (HCC)    Left Breast CA, mastectomy. No other tx's.   Diabetes mellitus without complication (HCC)    Metformin and Lantus   HTN (hypertension) 01/19/2021     Active Problem List   Patient Active Problem List   Diagnosis Date Noted   Constipation 05/22/2023   Cholelithiasis 12/01/2022   Dyslipidemia 12/01/2022   Type 2 diabetes mellitus without complications (HCC) 12/01/2022   GERD without esophagitis 12/01/2022   Hypertensive urgency 11/30/2022   TIA (transient ischemic attack) 01/19/2021   HTN (hypertension) 01/19/2021   Diabetes mellitus without complication (HCC)    GERD (gastroesophageal reflux disease)    HLD (hyperlipidemia)    History of breast cancer 09/22/2014     Past Surgical History   Past Surgical History:  Procedure Laterality Date   BREAST EXCISIONAL BIOPSY Right 1990's   MASTECTOMY Left 1993     Home Medications   Prior to Admission medications   Medication Sig Start Date End Date Taking? Authorizing Provider  aspirin EC 81 MG tablet Take 81 mg by mouth daily.    [provider]  cyanocobalamin 1000 MCG tablet Take 1,000 mcg  by mouth daily. 10/31/20   [provider]  dorzolamide-timolol (COSOPT) 2-0.5 % ophthalmic solution Place 1 drop into the right eye 2 (two) times daily.    [provider]  Insulin Glargine (BASAGLAR KWIKPEN) 100 UNIT/ML Inject 50 Units into the skin daily. 10/31/20   [provider]  JANUVIA 100 MG tablet Take 100 mg by mouth daily. 11/05/22   [provider]  metFORMIN (GLUCOPHAGE) 500 MG tablet Take 500 mg by mouth 2 (two) times daily. 10/23/20   [provider]  Multiple Vitamins-Minerals (PRESERVISION AREDS 2 PO) Take 1 tablet by mouth 2 (two) times daily.    [provider]  omeprazole (PRILOSEC) 40 MG capsule Take 40 mg by mouth daily. 01/09/21   [provider]  simvastatin (ZOCOR) 40 MG tablet Take 1 tablet (40 mg total) by mouth at bedtime. 01/20/21 11/30/22  Delfino Lovett, MD  triamterene-hydrochlorothiazide (MAXZIDE-25) 37.5-25 MG tablet Take 1 tablet by mouth daily as needed (systolic BP > 150 or diastolic BP > 90). 12/01/22 12/01/23  Esaw Grandchild A, DO     Allergies  Penicillins, Ace inhibitors, Amlodipine, Amoxicillin, Clarithromycin, Lovastatin, Other, Oxycodone-acetaminophen, Rabeprazole, Rosiglitazone, Sitagliptin, and Valsartan   Family History   Family History  Problem Relation Age of Onset   Breast cancer Neg Hx      Physical Exam  Triage Vital Signs: ED Triage Vitals  Enc Vitals Group     BP 05/22/23 0011 Marland Kitchen)  171/88     Pulse Rate 05/22/23 0011 91     Resp 05/22/23 0011 20     Temp 05/22/23 0011 97.6 F (36.4 C)     Temp Source 05/22/23 0011 Oral     SpO2 05/22/23 0011 97 %     Weight 05/22/23 0012 159 lb (72.1 kg)     Height 05/22/23 0012 5\' 3"  (1.6 m)     Head Circumference --      Peak Flow --      Pain Score --      Pain Loc --      Pain Edu? --      Excl. in GC? --     Updated Vital Signs: BP (!) 195/80   Pulse 81   Temp 97.8 F (36.6 C) (Oral)   Resp 18   Ht 5\' 3"  (1.6 m)   Wt  72.1 kg   SpO2 98%   BMI 28.17 kg/m    General: Awake, moderate distress.  CV:  RRR.  Good peripheral perfusion.  Resp:  Normal effort.  CTAB. Abd:  Mild diffuse tenderness to palpation without rebound or guarding.  No distention.  Other:  No truncal vesicles.   ED Results / Procedures / Treatments  Labs (all labs ordered are listed, but only abnormal results are displayed) Labs Reviewed  COMPREHENSIVE METABOLIC PANEL - Abnormal; Notable for the following components:      Result Value   Glucose, Bld 199 (*)    Calcium 8.5 (*)    All other components within normal limits  URINALYSIS, ROUTINE W REFLEX MICROSCOPIC - Abnormal; Notable for the following components:   Color, Urine STRAW (*)    APPearance CLEAR (*)    Glucose, UA 50 (*)    All other components within normal limits  CBC WITH DIFFERENTIAL/PLATELET  LIPASE, BLOOD  TROPONIN I (HIGH SENSITIVITY)     EKG  ED ECG REPORT I, Sed[place] Wenk J, the attending physician, personally viewed and interpreted this ECG.   Date: 05/22/2023  EKG Time: 0235  Rate: 89  Rhythm: normal sinus rhythm  Axis: Normal normal  Intervals:right bundle branch block  ST&T Change: Nonspecific    RADIOLOGY I have independently visualized and interpreted patient's x-ray and CT scan as well as noted the radiology interpretation:  KUB: Colonic constipation  CT abdomen/pelvis: No SBO, stool retention with rectal distention to 7 cm with imposed inflammation around the distal colon  Official radiology report(s): CT ABDOMEN PELVIS W CONTRAST  Result Date: 05/22/2023 CLINICAL DATA:  Acute, nonlocalized abdominal pain EXAM: CT ABDOMEN AND PELVIS WITH CONTRAST TECHNIQUE: Multidetector CT imaging of the abdomen and pelvis was performed using the standard protocol following bolus administration of intravenous contrast. RADIATION DOSE REDUCTION: This exam was performed according to the departmental dose-optimization program which includes automated  exposure control, adjustment of the mA and/or kV according to patient size and/or use of iterative reconstruction technique. CONTRAST:  OMNIPAQUE IOHEXOL 300 MG/ML  SOLN COMPARISON:  02/16/2023 FINDINGS: Lower chest: Mastectomy prosthesis on the left. Bulky mitral annular calcification. No acute finding. Hepatobiliary: No focal liver abnormality.Cholelithiasis. No evidence of acute cholecystitis. No biliary dilatation. Pancreas: Unremarkable. Spleen: Unremarkable. Adrenals/Urinary Tract: Negative adrenals. Fullness of the intrarenal collecting system without visible obstructive process. Renal hilar cysts on the left. There is also simple cortical cyst on the left. No follow-up imaging is recommended. Physiologic distension of the bladder. Stomach/Bowel: Diffuse stool in the colon with rectal distension to 7 cm. Mild fat stranding around  the descending sigmoid junction where there are diverticula but no focal diverticular inflammation. Small sliding hiatal hernia. Vascular/Lymphatic: No acute vascular abnormality. Diffuse atherosclerosis. No mass or adenopathy. Reproductive:Hysterectomy. Other: No ascites or pneumoperitoneum. Musculoskeletal: No acute abnormalities. Generalized lumbar spine degeneration with dextroscoliosis. Hip osteoarthritis greater on the right. IMPRESSION: 1. Stool retention with rectal distension to 7 cm. Mild superimposed inflammation around the distal colon. 2. Cholelithiasis and atherosclerosis. Electronically Signed   By: Tiburcio Pea M.D.   On: 05/22/2023 04:14   DG Abdomen 1 View  Result Date: 05/22/2023 CLINICAL DATA:  No bowel movement for 1 week EXAM: ABDOMEN - 1 VIEW COMPARISON:  None Available. FINDINGS: Scattered large and small bowel gas is noted. Fecal material is noted throughout the colon consistent with the given clinical history and constipation. No free air is seen. No abnormal mass or abnormal calcifications are noted. Degenerative changes of lumbar spine are  seen. IMPRESSION: Changes consistent with colonic constipation. Electronically Signed   By: Alcide Clever M.D.   On: 05/22/2023 01:03     PROCEDURES:  Critical Care performed: No  .1-3 Lead EKG Interpretation  Performed by: Irean Hong, MD Authorized by: Irean Hong, MD     Interpretation: normal     ECG rate:  90   ECG rate assessment: normal     Rhythm: sinus rhythm     Ectopy: none     Conduction: normal   Comments:     Patient placed on cardiac monitor to evaluate for arrhythmias Fecal disimpaction  Date/Time: 05/22/2023 5:31 AM  Performed by: Irean Hong, MD Authorized by: Irean Hong, MD  Consent: Verbal consent obtained. Written consent not obtained. Risks and benefits: risks, benefits and alternatives were discussed Consent given by: patient Patient understanding: patient states understanding of the procedure being performed Local anesthesia used: yes (Topical lidocaine jelly)  Anesthesia: Local anesthesia used: yes (Topical lidocaine jelly)  Sedation: Patient sedated: no  Patient tolerance: patient tolerated the procedure well with no immediate complications      MEDICATIONS ORDERED IN ED: Medications  metroNIDAZOLE (FLAGYL) IVPB 500 mg (500 mg Intravenous New Bag/Given 05/22/23 0531)  aspirin EC tablet 81 mg (has no administration in time range)  simvastatin (ZOCOR) tablet 40 mg (has no administration in time range)  triamterene-hydrochlorothiazide (MAXZIDE-25) 37.5-25 MG per tablet 1 tablet (has no administration in time range)  Basaglar KwikPen KwikPen 30 Units (has no administration in time range)  pantoprazole (PROTONIX) EC tablet 40 mg (has no administration in time range)  enoxaparin (LOVENOX) injection 40 mg (has no administration in time range)  acetaminophen (TYLENOL) tablet 650 mg (has no administration in time range)    Or  acetaminophen (TYLENOL) suppository 650 mg (has no administration in time range)  ondansetron (ZOFRAN) tablet 4 mg (has  no administration in time range)    Or  ondansetron (ZOFRAN) injection 4 mg (has no administration in time range)  insulin aspart (novoLOG) injection 0-15 Units (has no administration in time range)  insulin aspart (novoLOG) injection 0-5 Units (has no administration in time range)  hydrALAZINE (APRESOLINE) injection 5 mg (has no administration in time range)  sodium chloride 0.9 % bolus 500 mL (0 mLs Intravenous Stopped 05/22/23 0441)  diazepam (VALIUM) injection 1 mg (1 mg Intravenous Given 05/22/23 0303)  iohexol (OMNIPAQUE) 300 MG/ML solution 100 mL (100 mLs Intravenous Contrast Given 05/22/23 0403)  lactulose (CHRONULAC) 10 GM/15ML solution 30 g (30 g Oral Given 05/22/23 0455)  docusate (COLACE) 50 MG/5ML  liquid 100 mg (100 mg Per Tube Given 05/22/23 0455)  magnesium citrate solution 1 Bottle (1 Bottle Oral Given 05/22/23 0501)  lidocaine (XYLOCAINE) 2 % jelly 1 Application (1 Application Topical Given by Other 05/22/23 0526)     IMPRESSION / MDM / ASSESSMENT AND PLAN / ED COURSE  I reviewed the triage vital signs and the nursing notes.                             89 year old female presenting with abdominal pain and constipation. Differential diagnosis includes, but is not limited to, ovarian cyst, ovarian torsion, acute appendicitis, diverticulitis, urinary tract infection/pyelonephritis, endometriosis, bowel obstruction, colitis, renal colic, gastroenteritis, hernia, fibroids, etc.   Patient's presentation is most consistent with acute presentation with potential threat to life or bodily function.  The patient is on the cardiac monitor to evaluate for evidence of arrhythmia and/or significant heart rate changes.  KUB demonstrates colonic constipation.  Given patient's elderly age and diffuse abdominal tenderness to palpation, will obtain lab work and CT scan.  If unremarkable, will administer soapsuds enema.  Clinical Course as of 05/22/23 0532  Thu May 22, 2023  0358 Laboratory  results unremarkable.  Patient to CT scan. [JS]  [id number] Patient with persistent abdominal pain.  Updated patient and spouse on CT results.  Given degree of stool burden, rectal distention and inflammatory changes, will start bowel cleanse now in the emergency department but will consult hospital services for evaluation and admission. [JS]  0531 Patient had 1 tiny stool ball after soapsuds enema.  Agreed to fecal impaction.  Disimpacted 3 chunks of semisolid stool.  Patient currently resting. [JS]    Clinical Course User Index [JS] Irean Hong, MD     FINAL CLINICAL IMPRESSION(S) / ED DIAGNOSES   Final diagnoses:  Generalized abdominal pain  Constipation, unspecified constipation type  Stercoral colitis     Rx / DC Orders   ED Discharge Orders     None        Note:  This document was prepared using Dragon voice recognition software and may include unintentional dictation errors.   Irean Hong, MD 05/22/23 [id number]

## 2023-05-22 NOTE — H&P (Signed)
History and Physical    Patient: Susan Wolfe ZOX:[id number] DOB: 08/24/1928 DOA: 05/22/2023 DOS: the patient was seen and examined on 05/22/2023 PCP: Danella Penton, MD  Patient coming from: Home  Chief Complaint:  Chief Complaint  Patient presents with   Constipation    HPI: Susan Wolfe is a 89 y.o. female with medical history significant for DM, HTN, breast cancer s/p mastectomy, HLD, who presents to the ED with constipation not resolving with over-the-counter laxatives.  States she has not had a bowel movement in almost 2 weeks and has tried suppositories, stool softeners and enemas without success.  She reports abdominal discomfort.  Vomited once since arrival in the ED.  Denies fever or chills and denies dysuria. ED course and data review: BP up to 195/80 with otherwise normal vitals.  Labs unremarkable with normal CBC.  CMP notable only for elevated glucose of 199.  Lipase and LFTs WNL.  Troponin 4 1 urinalysis sterile.ED EKG, personally viewed and interpreted showing sinus rhythm with RBBB CT abdomen and pelvis with contrast significant for the following: IMPRESSION: 1. Stool retention with rectal distension to 7 cm. Mild superimposed inflammation around the distal colon. 2. Cholelithiasis and atherosclerosis.  Patient treated with Colace, lactulose and magnesium citrate without a bowel movement in the ED. patient was partially disimpacted in the ED and an enema was ordered at the time of request for admission. Hospitalist consulted   Review of Systems: As mentioned in the history of present illness. All other systems reviewed and are negative.  Past Medical History:  Diagnosis Date   Breast cancer (HCC) 1993   Cancer (HCC)    Left Breast CA, mastectomy. No other tx's.   Diabetes mellitus without complication (HCC)    Metformin and Lantus   HTN (hypertension) 01/19/2021   Past Surgical History:  Procedure Laterality Date   BREAST EXCISIONAL BIOPSY Right 1990's    MASTECTOMY Left 1993   Social History:  reports that she has never smoked. She has never used smokeless tobacco. She reports that she does not currently use alcohol. She reports that she does not use drugs.  Allergies  Allergen Reactions   Penicillins Hives   Ace Inhibitors     Other reaction(s): Cough   Amlodipine Other (See Comments)    Advised by eye doctor not to take   Amoxicillin     Other reaction(s): Vomiting   Clarithromycin Hives   Lovastatin     Other reaction(s): Other (See Comments) Fatigue   Other Hives    Jergen's   Oxycodone-Acetaminophen Nausea Only    Other reaction(s): Vomiting   Rabeprazole Diarrhea   Rosiglitazone Diarrhea   Sitagliptin Other (See Comments)   Valsartan     Other reaction(s): Angioedema    Family History  Problem Relation Age of Onset   Breast cancer Neg Hx     Prior to Admission medications   Medication Sig Start Date End Date Taking? Authorizing Provider  aspirin EC 81 MG tablet Take 81 mg by mouth daily.    [provider]  cyanocobalamin 1000 MCG tablet Take 1,000 mcg by mouth daily. 10/31/20   [provider]  dorzolamide-timolol (COSOPT) 2-0.5 % ophthalmic solution Place 1 drop into the right eye 2 (two) times daily.    [provider]  Insulin Glargine (BASAGLAR KWIKPEN) 100 UNIT/ML Inject 50 Units into the skin daily. 10/31/20   [provider]  JANUVIA 100 MG tablet Take 100 mg by mouth daily. 11/05/22  [provider]  metFORMIN (GLUCOPHAGE) 500 MG tablet Take 500 mg by mouth 2 (two) times daily. 10/23/20   [provider]  Multiple Vitamins-Minerals (PRESERVISION AREDS 2 PO) Take 1 tablet by mouth 2 (two) times daily.    [provider]  omeprazole (PRILOSEC) 40 MG capsule Take 40 mg by mouth daily. 01/09/21   [provider]  simvastatin (ZOCOR) 40 MG tablet Take 1 tablet (40 mg total) by mouth at bedtime. 01/20/21 11/30/22  Delfino Lovett, MD   triamterene-hydrochlorothiazide (MAXZIDE-25) 37.5-25 MG tablet Take 1 tablet by mouth daily as needed (systolic BP > 150 or diastolic BP > 90). 12/01/22 12/01/23  Pennie Banter, DO    Physical Exam: Vitals:   05/22/23 0012 05/22/23 0300 05/22/23 0330 05/22/23 0455  BP:  (!) 175/79 (!) 195/80   Pulse:  85 81   Resp:  19 18   Temp:    97.8 F (36.6 C)  TempSrc:    Oral  SpO2:  98% 98%   Weight: 72.1 kg     Height: 5\' 3"  (1.6 m)      Physical Exam Vitals and nursing note reviewed.  Constitutional:      General: She is not in acute distress. HENT:     Head: Normocephalic and atraumatic.  Cardiovascular:     Rate and Rhythm: Normal rate and regular rhythm.     Heart sounds: Normal heart sounds.  Pulmonary:     Effort: Pulmonary effort is normal.     Breath sounds: Normal breath sounds.  Abdominal:     Palpations: Abdomen is soft.     Tenderness: There is no abdominal tenderness.  Neurological:     Mental Status: Mental status is at baseline.     Labs on Admission: I have personally reviewed following labs and imaging studies  CBC: Recent Labs  Lab 05/22/23 0251  WBC 7.0  NEUTROABS 5.1  HGB 12.8  HCT 40.1  MCV 97.3  PLT 188   Basic Metabolic Panel: Recent Labs  Lab 05/22/23 0251  NA 138  K 3.9  CL 105  CO2 24  GLUCOSE 199*  BUN 20  CREATININE 0.65  CALCIUM 8.5*   GFR: Estimated Creatinine Clearance: 41.8 mL/min (by C-G formula based on SCr of 0.65 mg/dL). Liver Function Tests: Recent Labs  Lab 05/22/23 0251  AST 17  ALT 15  ALKPHOS 90  BILITOT 0.7  PROT 6.8  ALBUMIN 3.7   Recent Labs  Lab 05/22/23 0251  LIPASE 23   No results for input(s): "AMMONIA" in the last 168 hours. Coagulation Profile: No results for input(s): "INR", "PROTIME" in the last 168 hours. Cardiac Enzymes: No results for input(s): "CKTOTAL", "CKMB", "CKMBINDEX", "TROPONINI" in the last 168 hours. BNP (last 3 results) No results for input(s): "PROBNP" in the last  8760 hours. HbA1C: No results for input(s): "HGBA1C" in the last 72 hours. CBG: No results for input(s): "GLUCAP" in the last 168 hours. Lipid Profile: No results for input(s): "CHOL", "HDL", "LDLCALC", "TRIG", "CHOLHDL", "LDLDIRECT" in the last 72 hours. Thyroid Function Tests: No results for input(s): "TSH", "T4TOTAL", "FREET4", "T3FREE", "THYROIDAB" in the last 72 hours. Anemia Panel: No results for input(s): "VITAMINB12", "FOLATE", "FERRITIN", "TIBC", "IRON", "RETICCTPCT" in the last 72 hours. Urine analysis:    Component Value Date/Time   COLORURINE STRAW (A) 05/22/2023 0428   APPEARANCEUR CLEAR (A) 05/22/2023 0428   LABSPEC 1.024 05/22/2023 0428   PHURINE 8.0 05/22/2023 0428   GLUCOSEU 50 (A) 05/22/2023 1610  HGBUR NEGATIVE 05/22/2023 0428   BILIRUBINUR NEGATIVE 05/22/2023 0428   KETONESUR NEGATIVE 05/22/2023 0428   PROTEINUR NEGATIVE 05/22/2023 0428   NITRITE NEGATIVE 05/22/2023 0428   LEUKOCYTESUR NEGATIVE 05/22/2023 0428    Radiological Exams on Admission: CT ABDOMEN PELVIS W CONTRAST  Result Date: 05/22/2023 CLINICAL DATA:  Acute, nonlocalized abdominal pain EXAM: CT ABDOMEN AND PELVIS WITH CONTRAST TECHNIQUE: Multidetector CT imaging of the abdomen and pelvis was performed using the standard protocol following bolus administration of intravenous contrast. RADIATION DOSE REDUCTION: This exam was performed according to the departmental dose-optimization program which includes automated exposure control, adjustment of the mA and/or kV according to patient size and/or use of iterative reconstruction technique. CONTRAST:  OMNIPAQUE IOHEXOL 300 MG/ML  SOLN COMPARISON:  02/16/2023 FINDINGS: Lower chest: Mastectomy prosthesis on the left. Bulky mitral annular calcification. No acute finding. Hepatobiliary: No focal liver abnormality.Cholelithiasis. No evidence of acute cholecystitis. No biliary dilatation. Pancreas: Unremarkable. Spleen: Unremarkable. Adrenals/Urinary Tract:  Negative adrenals. Fullness of the intrarenal collecting system without visible obstructive process. Renal hilar cysts on the left. There is also simple cortical cyst on the left. No follow-up imaging is recommended. Physiologic distension of the bladder. Stomach/Bowel: Diffuse stool in the colon with rectal distension to 7 cm. Mild fat stranding around the descending sigmoid junction where there are diverticula but no focal diverticular inflammation. Small sliding hiatal hernia. Vascular/Lymphatic: No acute vascular abnormality. Diffuse atherosclerosis. No mass or adenopathy. Reproductive:Hysterectomy. Other: No ascites or pneumoperitoneum. Musculoskeletal: No acute abnormalities. Generalized lumbar spine degeneration with dextroscoliosis. Hip osteoarthritis greater on the right. IMPRESSION: 1. Stool retention with rectal distension to 7 cm. Mild superimposed inflammation around the distal colon. 2. Cholelithiasis and atherosclerosis. Electronically Signed   By: Tiburcio Pea M.D.   On: 05/22/2023 04:14   DG Abdomen 1 View  Result Date: 05/22/2023 CLINICAL DATA:  No bowel movement for 1 week EXAM: ABDOMEN - 1 VIEW COMPARISON:  None Available. FINDINGS: Scattered large and small bowel gas is noted. Fecal material is noted throughout the colon consistent with the given clinical history and constipation. No free air is seen. No abnormal mass or abnormal calcifications are noted. Degenerative changes of lumbar spine are seen. IMPRESSION: Changes consistent with colonic constipation. Electronically Signed   By: Alcide Clever M.D.   On: 05/22/2023 01:03     Data Reviewed: Relevant notes from primary care and specialist visits, past discharge summaries as available in EHR, including Care Everywhere. Prior diagnostic testing as pertinent to current admission diagnoses Updated medications and problem lists for reconciliation ED course, including vitals, labs, imaging, treatment and response to treatment Triage  notes, nursing and pharmacy notes and ED provider's notes Notable results as noted in HPI   Assessment and Plan: Constipation Continue tapwater enema ordered from the ED MiraLAX daily with Dulcolax nightly and additional laxatives as needed Empiric Flagyl was given for possible stercoral colitis based on CT, not continued at this time  Hypertensive urgency SBP over 190 in the ED Patient is on thiazide diuretics which we will continue Hydralazine to assist with BP control  Type 2 diabetes mellitus without complications (HCC) Continue basal insulin, Januvia Sliding scale insulin coverage  Dyslipidemia Continue statin    DVT prophylaxis: Lovenox  Consults: none  Advance Care Planning:   Code Status: DNR   Family Communication: none  Disposition Plan: Back to previous home environment  Severity of Illness: The appropriate patient status for this patient is OBSERVATION. Observation status is judged to be reasonable and necessary  in order to provide the required intensity of service to ensure the patient's safety. The patient's presenting symptoms, physical exam findings, and initial radiographic and laboratory data in the context of their medical condition is felt to place them at decreased risk for further clinical deterioration. Furthermore, it is anticipated that the patient will be medically stable for discharge from the hospital within 2 midnights of admission.   Author: Andris Baumann, MD 05/22/2023 5:23 AM  For on call review [email or web address].

## 2023-05-22 NOTE — Assessment & Plan Note (Signed)
Continue basal insulin, Januvia Sliding scale insulin coverage

## 2023-05-22 NOTE — Consult Note (Signed)
Inpatient Consultation   Patient ID: Susan Wolfe is a 89 y.o. female.  Requesting Provider: Enedina Finner  Date of Admission: 05/22/2023  Date of Consult: 05/22/23   Reason for Consultation: Constipation   Patient's Chief Complaint:   Chief Complaint  Patient presents with   Constipation    89 y/o CF c DM2, HTN, Breast CA s/p mastectomy, HLD who presented this morning with constipation not resolving with over the counter medications.  She is accompanied by her husband. At time of my evaluation, she is on the toilet attempting to stool.  At home she trialed stool softener, laxative suppository and enemas without success. Reports it ahs been two weeks without a BM and notes worsening abdominal discomfort. Vomiting x1, but otherwise none. Some nausea. No fever or chills. Appetite and weight stable.  In chart review, she historically has had constipation. It took multiple attempts to be clear enough for barium enema exam.  Some successful disimpaction by ED Provider. She also received colace, lactulose, and mag citrate this morning  05/22/23 CT with contrast- stool throughout colon with some rectal distention to 7 cm. 02/16/23 CT with contrast- diffuse stool- diverticular disease. No obstruction  Denies NSAIDs, Anti-plt agents, and anticoagulants Brother- CRC Denies other family history of gastrointestinal disease and malignancy Previous Endoscopies: 02/13/2010- CSY- aborted- restricted mobility of sigmoid colon; severe diverticular disease. Scope unable to traverse  01/2003- CSY- diverticulosis, otherwise normal  03/2010- Colosure positive  2011- Barium enema RESULT:     The anticipated procedure was discussed with Ms. Noland. She  voiced her willingness to proceed. The patient had undergone multiple  attempts at bowel preparation over the week but was unable to be  adequately  cleaned for an air contrast study. On today's study there remains some  stool  in the bowel. The  scout film reveals no obstructive pattern. There is  degenerative change of the lumbar spine with scoliosis convex toward the  right. There are calcifications laterally which likely lie within the  spleen.   The barium catheter balloon was inflated under fluoroscopic guidance.  Barium  flowed freely into the rectum. There was some transient delay as it passed  through the spasmodic sigmoid. Numerous diverticula were noted in the  sigmoid. Barium then flowed freely in a retrograde fashion to the cecum.  The  appendix did not fill and reportedly is surgically absent. Some reflux  into  the terminal ileum was demonstrated.   There is a small amount of stool that persists within the bowel but it  moves  with the barium column. No definite polypoid lesions are demonstrated and  no  annular constricting lesions are seen. There are scattered diverticula in  the descending colon with more diverticula in the rectosigmoid. The  postevacuation film reveals good clearing of the bowel.   IMPRESSION:  1. There is sigmoid diverticulosis without evidence of acute  diverticulitis.  Scattered diverticula in the descending colon are demonstrated as well.  2. No annular constricting lesions or polypoid masses are demonstrated.  There is a small amount of retained stool within the colon.      Past Medical History:  Diagnosis Date   Breast cancer (HCC) 1993   Cancer (HCC)    Left Breast CA, mastectomy. No other tx's.   Diabetes mellitus without complication (HCC)    Metformin and Lantus   HTN (hypertension) 01/19/2021    Past Surgical History:  Procedure Laterality Date   BREAST EXCISIONAL BIOPSY Right 1990's  MASTECTOMY Left 1993    Allergies  Allergen Reactions   Penicillins Hives   Ace Inhibitors     Other reaction(s): Cough   Amlodipine Other (See Comments)    Advised by eye doctor not to take   Amoxicillin     Other reaction(s): Vomiting   Clarithromycin Hives   Lovastatin      Other reaction(s): Other (See Comments) Fatigue   Other Hives    Jergen's   Oxycodone-Acetaminophen Nausea Only    Other reaction(s): Vomiting   Rabeprazole Diarrhea   Rosiglitazone Diarrhea   Sitagliptin Other (See Comments)   Valsartan     Other reaction(s): Angioedema    Family History  Problem Relation Age of Onset   Breast cancer Neg Hx     Social History   Tobacco Use   Smoking status: Never   Smokeless tobacco: Never  Vaping Use   Vaping Use: Never used  Substance Use Topics   Alcohol use: Not Currently   Drug use: Never     Pertinent GI related history and allergies were reviewed with the patient  Review of Systems  Constitutional:  Negative for activity change, appetite change, chills, diaphoresis, fatigue, fever and unexpected weight change.  HENT:  Negative for trouble swallowing and voice change.   Respiratory:  Negative for shortness of breath and wheezing.   Cardiovascular:  Negative for chest pain, palpitations and leg swelling.  Gastrointestinal:  Positive for abdominal pain, constipation and nausea. Negative for abdominal distention, anal bleeding, blood in stool, diarrhea, rectal pain and vomiting.  Musculoskeletal:  Negative for arthralgias and myalgias.  Skin:  Negative for color change and pallor.  Neurological:  Negative for dizziness, syncope and weakness.  Psychiatric/Behavioral:  Negative for confusion.   All other systems reviewed and are negative.    Medications Home Medications No current facility-administered medications on file prior to encounter.   Current Outpatient Medications on File Prior to Encounter  Medication Sig Dispense Refill   aspirin EC 81 MG tablet Take 81 mg by mouth daily.     cyanocobalamin 1000 MCG tablet Take 1,000 mcg by mouth daily.     dorzolamide-timolol (COSOPT) 2-0.5 % ophthalmic solution Place 1 drop into the right eye 2 (two) times daily.     doxazosin (CARDURA) 2 MG tablet Take 2 mg by mouth daily.      Insulin Glargine (BASAGLAR KWIKPEN) 100 UNIT/ML Inject 45 Units into the skin daily.     JANUVIA 100 MG tablet Take 100 mg by mouth daily.     metFORMIN (GLUCOPHAGE) 500 MG tablet Take 500 mg by mouth 2 (two) times daily.     Multiple Vitamins-Minerals (PRESERVISION AREDS 2 PO) Take 1 tablet by mouth 2 (two) times daily.     omeprazole (PRILOSEC) 40 MG capsule Take 40 mg by mouth daily.     simvastatin (ZOCOR) 40 MG tablet Take 1 tablet (40 mg total) by mouth at bedtime. 30 tablet 0   triamterene-hydrochlorothiazide (MAXZIDE-25) 37.5-25 MG tablet Take 1 tablet by mouth daily as needed (systolic BP > 150 or diastolic BP > 90). 30 tablet 0   Pertinent GI related medications were reviewed with the patient  Inpatient Medications  Current Facility-Administered Medications:    acetaminophen (TYLENOL) tablet 650 mg, 650 mg, Oral, Q6H PRN **OR** acetaminophen (TYLENOL) suppository 650 mg, 650 mg, Rectal, Q6H PRN, Andris Baumann, MD   aspirin EC tablet 81 mg, 81 mg, Oral, Daily, Andris Baumann, MD, 81 mg at 05/22/23 [phone]  bisacodyl (DULCOLAX) EC tablet 5 mg, 5 mg, Oral, Daily PRN, Andris Baumann, MD   bisacodyl (DULCOLAX) suppository 10 mg, 10 mg, Rectal, Daily, Enedina Finner, MD, 10 mg at 05/22/23 0949   enoxaparin (LOVENOX) injection 40 mg, 40 mg, Subcutaneous, Q24H, Andris Baumann, MD   hydrALAZINE (APRESOLINE) injection 5 mg, 5 mg, Intravenous, Q6H PRN, Andris Baumann, MD   insulin aspart (novoLOG) injection 0-15 Units, 0-15 Units, Subcutaneous, TID WC, Andris Baumann, MD, 11 Units at 05/22/23 1209   insulin aspart (novoLOG) injection 0-5 Units, 0-5 Units, Subcutaneous, QHS, Para March, Odetta Pink, MD   insulin glargine-yfgn (SEMGLEE) injection 30 Units, 30 Units, Subcutaneous, Q2200, Andris Baumann, MD   ondansetron (ZOFRAN) tablet 4 mg, 4 mg, Oral, Q6H PRN **OR** ondansetron (ZOFRAN) injection 4 mg, 4 mg, Intravenous, Q6H PRN, Andris Baumann, MD   pantoprazole (PROTONIX) EC tablet 40 mg, 40  mg, Oral, Daily, Lindajo Royal V, MD, 40 mg at 05/22/23 0949   [START ON 05/23/2023] polyethylene glycol (MIRALAX / GLYCOLAX) packet 17 g, 17 g, Oral, Daily, Enedina Finner, MD   senna-docusate (Senokot-S) tablet 1 tablet, 1 tablet, Oral, BID, Andris Baumann, MD, 1 tablet at 05/22/23 0949   simvastatin (ZOCOR) tablet 40 mg, 40 mg, Oral, QHS, Andris Baumann, MD   sodium phosphate (FLEET) 7-19 GM/118ML enema 1 enema, 1 enema, Rectal, Once, Enedina Finner, MD   triamterene-hydrochlorothiazide (MAXZIDE-25) 37.5-25 MG per tablet 1 tablet, 1 tablet, Oral, Daily PRN, Andris Baumann, MD  Current Outpatient Medications:    aspirin EC 81 MG tablet, Take 81 mg by mouth daily., Disp: , Rfl:    cyanocobalamin 1000 MCG tablet, Take 1,000 mcg by mouth daily., Disp: , Rfl:    dorzolamide-timolol (COSOPT) 2-0.5 % ophthalmic solution, Place 1 drop into the right eye 2 (two) times daily., Disp: , Rfl:    doxazosin (CARDURA) 2 MG tablet, Take 2 mg by mouth daily., Disp: , Rfl:    Insulin Glargine (BASAGLAR KWIKPEN) 100 UNIT/ML, Inject 45 Units into the skin daily., Disp: , Rfl:    JANUVIA 100 MG tablet, Take 100 mg by mouth daily., Disp: , Rfl:    metFORMIN (GLUCOPHAGE) 500 MG tablet, Take 500 mg by mouth 2 (two) times daily., Disp: , Rfl:    Multiple Vitamins-Minerals (PRESERVISION AREDS 2 PO), Take 1 tablet by mouth 2 (two) times daily., Disp: , Rfl:    omeprazole (PRILOSEC) 40 MG capsule, Take 40 mg by mouth daily., Disp: , Rfl:    simvastatin (ZOCOR) 40 MG tablet, Take 1 tablet (40 mg total) by mouth at bedtime., Disp: 30 tablet, Rfl: 0   triamterene-hydrochlorothiazide (MAXZIDE-25) 37.5-25 MG tablet, Take 1 tablet by mouth daily as needed (systolic BP > 150 or diastolic BP > 90)., Disp: 30 tablet, Rfl: 0   acetaminophen **OR** acetaminophen, bisacodyl, hydrALAZINE, ondansetron **OR** ondansetron (ZOFRAN) IV, triamterene-hydrochlorothiazide   Objective   Vitals:   05/22/23 0455 05/22/23 0615 05/22/23 0904  05/22/23 1133  BP:  (!) 133/58  (!) 189/95  Pulse:  (!) 106  (!) 120  Resp:  (!) 25  (!) 24  Temp: 97.8 F (36.6 C)  97.9 F (36.6 C) 98.6 F (37 C)  TempSrc: Oral  Oral Oral  SpO2:  98%  100%  Weight:      Height:         Physical Exam Vitals and nursing note reviewed.  Constitutional:      General: She is not in acute distress.  Appearance: Normal appearance. She is not ill-appearing, toxic-appearing or diaphoretic.     Comments: Patient on the commode during evaluation, so physical exam was limited   HENT:     Head: Normocephalic and atraumatic.     Nose: Nose normal.     Mouth/Throat:     Mouth: Mucous membranes are moist.     Pharynx: Oropharynx is clear.  Cardiovascular:     Rate and Rhythm: Normal rate and regular rhythm.  Pulmonary:     Effort: Pulmonary effort is normal. No respiratory distress.     Breath sounds: Normal breath sounds.  Musculoskeletal:     Cervical back: Neck supple.  Skin:    General: Skin is warm and dry.     Coloration: Skin is not jaundiced or pale.  Neurological:     General: No focal deficit present.     Mental Status: She is alert and oriented to person, place, and time. Mental status is at baseline.  Psychiatric:        Mood and Affect: Mood normal.        Behavior: Behavior normal.        Thought Content: Thought content normal.        Judgment: Judgment normal.     Laboratory Data Recent Labs  Lab 05/22/23 0251  WBC 7.0  HGB 12.8  HCT 40.1  PLT 188   Recent Labs  Lab 05/22/23 0251  NA 138  K 3.9  CL 105  CO2 24  BUN 20  CALCIUM 8.5*  PROT 6.8  BILITOT 0.7  ALKPHOS 90  ALT 15  AST 17  GLUCOSE 199*   No results for input(s): "INR" in the last 168 hours.  Recent Labs    05/22/23 0251  LIPASE 23        Imaging Studies: CT ABDOMEN PELVIS W CONTRAST  Result Date: 05/22/2023 CLINICAL DATA:  Acute, nonlocalized abdominal pain EXAM: CT ABDOMEN AND PELVIS WITH CONTRAST TECHNIQUE: Multidetector CT  imaging of the abdomen and pelvis was performed using the standard protocol following bolus administration of intravenous contrast. RADIATION DOSE REDUCTION: This exam was performed according to the departmental dose-optimization program which includes automated exposure control, adjustment of the mA and/or kV according to patient size and/or use of iterative reconstruction technique. CONTRAST:  OMNIPAQUE IOHEXOL 300 MG/ML  SOLN COMPARISON:  02/16/2023 FINDINGS: Lower chest: Mastectomy prosthesis on the left. Bulky mitral annular calcification. No acute finding. Hepatobiliary: No focal liver abnormality.Cholelithiasis. No evidence of acute cholecystitis. No biliary dilatation. Pancreas: Unremarkable. Spleen: Unremarkable. Adrenals/Urinary Tract: Negative adrenals. Fullness of the intrarenal collecting system without visible obstructive process. Renal hilar cysts on the left. There is also simple cortical cyst on the left. No follow-up imaging is recommended. Physiologic distension of the bladder. Stomach/Bowel: Diffuse stool in the colon with rectal distension to 7 cm. Mild fat stranding around the descending sigmoid junction where there are diverticula but no focal diverticular inflammation. Small sliding hiatal hernia. Vascular/Lymphatic: No acute vascular abnormality. Diffuse atherosclerosis. No mass or adenopathy. Reproductive:Hysterectomy. Other: No ascites or pneumoperitoneum. Musculoskeletal: No acute abnormalities. Generalized lumbar spine degeneration with dextroscoliosis. Hip osteoarthritis greater on the right. IMPRESSION: 1. Stool retention with rectal distension to 7 cm. Mild superimposed inflammation around the distal colon. 2. Cholelithiasis and atherosclerosis. Electronically Signed   By: Tiburcio Pea M.D.   On: 05/22/2023 04:14   DG Abdomen 1 View  Result Date: 05/22/2023 CLINICAL DATA:  No bowel movement for 1 week EXAM: ABDOMEN - 1 VIEW  COMPARISON:  None Available. FINDINGS: Scattered  large and small bowel gas is noted. Fecal material is noted throughout the colon consistent with the given clinical history and constipation. No free air is seen. No abnormal mass or abnormal calcifications are noted. Degenerative changes of lumbar spine are seen. IMPRESSION: Changes consistent with colonic constipation. Electronically Signed   By: Alcide Clever M.D.   On: 05/22/2023 01:03    Assessment:   # Severe Constipation - no signs of obstruction on imaging - suspect stercoral colitis given some inflammation in dilated rectum 7cm - underwent partial manual disimpaction in ED  # HTN urgency  # DM2- may have some dysmotility due to autonomic dysfunction  # h/o breast cancer s/p mastectomy # TIA # HLD  Plan:  Pt currently receiving go lytely She received rectal suppository Recommend BID Enema Need stimulation from above and below Had trialed miralax, enema, and suppository at home without production Continued hydration Change to fluid based diet Abx not indicated from gi standpoint at this time No obstructive pattern on imaging. No signs of ogilve at this time Recently started BP med- hydrochlorothiazide/Triamterene adverse effect is constipation. Consider discontinuation, however pt presenting with htn as well  Reviewed previous outpatient office and endoscopy records  Encourage ambulation if possible Avoid gut motility slowing agents such as opioids  I personally performed the service.  Management of other medical comorbidities as per primary team  Thank you for allowing [place] to participate in this patient's care. Please don't hesitate to call if any questions or concerns arise.   Jaynie Collins, DO [facility]  Portions of the record may have been created with voice recognition software. Occasional wrong-word or 'sound-a-like' substitutions may have occurred due to the inherent limitations of voice recognition software.  Read the chart  carefully and recognize, using context, where substitutions may have occurred.

## 2023-05-22 NOTE — Assessment & Plan Note (Signed)
Continue tapwater enema ordered from the ED MiraLAX daily with Dulcolax nightly and additional laxatives as needed Empiric Flagyl was given for possible stercoral colitis based on CT, not continued at this time

## 2023-05-22 NOTE — ED Triage Notes (Signed)
Pt reports she has not had a BM in over a week. Pt reports she has attempted to find relief with suppositories, laxatives, stool softeners and enemas but no success.

## 2023-05-22 NOTE — ED Notes (Signed)
Patient up and down to toilet in room with scant bm each attempt. Patient encouraged to drink bowel prep. GI MD at the bedside for consultation.

## 2023-05-22 NOTE — ED Notes (Signed)
Sec notified to arrange transport to floor.

## 2023-05-22 NOTE — Progress Notes (Addendum)
[facility]  - [place] at [facility]   PATIENT NAME: Susan Wolfe    MR#:  [id number]  DATE OF BIRTH:  10-29-1928  SUBJECTIVE:  husband at bedside. Patient came in with constipation for more than a week. She has had symptoms in the remote past. Had some vomiting at home. Has had several different things prescribed for constipation. Having scanned bowel movement. Some disinfection was done by ER physician    VITALS:  Blood pressure 136/67, pulse (!) 109, temperature 98.6 F (37 C), temperature source Oral, resp. rate (!) 24, height 5\' 3"  (1.6 m), weight 72.1 kg, SpO2 100 %.  PHYSICAL EXAMINATION:   GENERAL:  89 y.o.-year-old patient with no acute distress.  LUNGS: Normal breath sounds bilaterally, no wheezing CARDIOVASCULAR: S1, S2 normal. No murmur   ABDOMEN: Soft, nontender, nondistended.  EXTREMITIES: No  edema b/l.    NEUROLOGIC: nonfocal  patient is alert and awake   LABORATORY PANEL:  CBC Recent Labs  Lab 05/22/23 0251  WBC 7.0  HGB 12.8  HCT 40.1  PLT 188    Chemistries  Recent Labs  Lab 05/22/23 0251  NA 138  K 3.9  CL 105  CO2 24  GLUCOSE 199*  BUN 20  CREATININE 0.65  CALCIUM 8.5*  AST 17  ALT 15  ALKPHOS 90  BILITOT 0.7   Cardiac Enzymes No results for input(s): "TROPONINI" in the last 168 hours. RADIOLOGY:  CT ABDOMEN PELVIS W CONTRAST  Result Date: 05/22/2023 CLINICAL DATA:  Acute, nonlocalized abdominal pain EXAM: CT ABDOMEN AND PELVIS WITH CONTRAST TECHNIQUE: Multidetector CT imaging of the abdomen and pelvis was performed using the standard protocol following bolus administration of intravenous contrast. RADIATION DOSE REDUCTION: This exam was performed according to the departmental dose-optimization program which includes automated exposure control, adjustment of the mA and/or kV according to patient size and/or use of iterative reconstruction technique. CONTRAST:  OMNIPAQUE IOHEXOL 300 MG/ML  SOLN COMPARISON:   02/16/2023 FINDINGS: Lower chest: Mastectomy prosthesis on the left. Bulky mitral annular calcification. No acute finding. Hepatobiliary: No focal liver abnormality.Cholelithiasis. No evidence of acute cholecystitis. No biliary dilatation. Pancreas: Unremarkable. Spleen: Unremarkable. Adrenals/Urinary Tract: Negative adrenals. Fullness of the intrarenal collecting system without visible obstructive process. Renal hilar cysts on the left. There is also simple cortical cyst on the left. No follow-up imaging is recommended. Physiologic distension of the bladder. Stomach/Bowel: Diffuse stool in the colon with rectal distension to 7 cm. Mild fat stranding around the descending sigmoid junction where there are diverticula but no focal diverticular inflammation. Small sliding hiatal hernia. Vascular/Lymphatic: No acute vascular abnormality. Diffuse atherosclerosis. No mass or adenopathy. Reproductive:Hysterectomy. Other: No ascites or pneumoperitoneum. Musculoskeletal: No acute abnormalities. Generalized lumbar spine degeneration with dextroscoliosis. Hip osteoarthritis greater on the right. IMPRESSION: 1. Stool retention with rectal distension to 7 cm. Mild superimposed inflammation around the distal colon. 2. Cholelithiasis and atherosclerosis. Electronically Signed   By: Tiburcio Pea M.D.   On: 05/22/2023 04:14   DG Abdomen 1 View  Result Date: 05/22/2023 CLINICAL DATA:  No bowel movement for 1 week EXAM: ABDOMEN - 1 VIEW COMPARISON:  None Available. FINDINGS: Scattered large and small bowel gas is noted. Fecal material is noted throughout the colon consistent with the given clinical history and constipation. No free air is seen. No abnormal mass or abnormal calcifications are noted. Degenerative changes of lumbar spine are seen. IMPRESSION: Changes consistent with colonic constipation. Electronically Signed   By: Alcide Clever M.D.   On:  05/22/2023 01:03    Assessment and Plan Susan Wolfe is a 89 y.o.  female with medical history significant for DM, HTN, breast cancer s/p mastectomy, HLD, who presents to the ED with constipation not resolving with over-the-counter laxatives.  States she has not had a bowel movement in almost 2 weeks and has tried suppositories, stool softeners and enemas without success.  She reports abdominal discomfort.  Vomited once since arrival in the ED.    CT abdomen and pelvis with contrast significant for the following: IMPRESSION: 1. Stool retention with rectal distension to 7 cm. Mild superimposed inflammation around the distal colon. 2. Cholelithiasis and atherosclerosis.  Severe constipation -- patient has received alcoholic suppository, lactulose, MiraLAX, Senna, Golightly bowel prep, MiraLAX -- some disinfection retried by ER physician. -- G.I. consultation with Dr. Timothy Lasso. Recommends continue Golytely /Colon prep -- patient having some scant bowel movement.   Hypertensive urgency --SBP over 190 in the ED --Patient is on thiazide diuretics  --Hydralazine to assist with BP control   Type 2 diabetes mellitus with hyperglycemia (HCC) --pt on Insulin,metformin, Januvia --Sliding scale insulin coverage --check Hgba1c   Dyslipidemia --Continue statin       DVT prophylaxis: Lovenox  Consults:GI  Advance Care Planning:   Code Status: DNR   Family Communication: none  Level of care: Med-Surg Status is: Observation The patient remains OBS appropriate and will d/c before 2 midnights.    TOTAL TIME TAKING CARE OF THIS PATIENT: 35 minutes.  >50% time spent on counselling and coordination of care  Note: This dictation was prepared with Dragon dictation along with smaller phrase technology. Any transcriptional errors that result from this process are unintentional.  Enedina Finner M.D    [facility]   CC: Primary care physician; Danella Penton, MD

## 2023-05-22 NOTE — Assessment & Plan Note (Signed)
SBP over 190 in the ED Patient is on thiazide diuretics which we will continue Hydralazine to assist with BP control

## 2023-05-22 NOTE — Assessment & Plan Note (Signed)
Continue statin. 

## 2023-05-23 DIAGNOSIS — R1084 Generalized abdominal pain: Secondary | ICD-10-CM | POA: Diagnosis not present

## 2023-05-23 DIAGNOSIS — K5900 Constipation, unspecified: Secondary | ICD-10-CM | POA: Diagnosis not present

## 2023-05-23 DIAGNOSIS — E785 Hyperlipidemia, unspecified: Secondary | ICD-10-CM | POA: Diagnosis not present

## 2023-05-23 LAB — GLUCOSE, CAPILLARY
Glucose-Capillary: 53 mg/dL — ABNORMAL LOW (ref 70–99)
Glucose-Capillary: 199 mg/dL — ABNORMAL HIGH (ref 70–99)

## 2023-05-23 MED ORDER — POLYETHYLENE GLYCOL 3350 17 G PO PACK: 17.0000 g | PACK | Freq: Every day | ORAL | 0 refills | Status: AC

## 2023-05-23 MED ORDER — SENNOSIDES-DOCUSATE SODIUM 8.6-50 MG PO TABS: 1.0000 | ORAL_TABLET | Freq: Two times a day (BID) | ORAL | 1 refills | Status: AC

## 2023-05-23 MED ORDER — INSULIN GLARGINE-YFGN 100 UNIT/ML ~~LOC~~ SOLN
22.0000 [IU] | Freq: Every day | SUBCUTANEOUS | Status: DC
  Filled 2023-05-23: qty 0.22

## 2023-05-23 NOTE — Discharge Summary (Addendum)
Physician Discharge Summary   Patient: Susan Wolfe MRN: [id number] DOB: 1928-01-03  Admit date:     05/22/2023  Discharge date: 05/23/23  Discharge Physician: Enedina Finner   PCP: Danella Penton, MD   Recommendations at discharge:   patient follow-up with PCP in 1 to 2 week  Discharge Diagnoses: Principal Problem:   Constipation Active Problems:   Hypertensive urgency   Dyslipidemia   Type 2 diabetes mellitus without complications (HCC)   Susan Wolfe is a 89 y.o. female with medical history significant for DM, HTN, breast cancer s/p mastectomy, HLD, who presents to the ED with constipation not resolving with over-the-counter laxatives.  States she has not had a bowel movement in almost 2 weeks and has tried suppositories, stool softeners and enemas without success.  She reports abdominal discomfort.  Vomited once since arrival in the ED.     CT abdomen and pelvis with contrast significant for the following: IMPRESSION: 1. Stool retention with rectal distension to 7 cm. Mild superimposed inflammation around the distal colon. 2. Cholelithiasis and atherosclerosis.   Severe constipation -- patient has received alcoholic suppository, lactulose, MiraLAX, Senna, Golightly bowel prep, MiraLAX -- some disinfection retried by ER physician. -- G.I. consultation with Dr. Timothy Lasso. Recommends continue Golytely /Colon prep -- patient having some scant bowel movement. --6/21-- patient reportedly had massive bowel movements as reported by RN. She feels a whole lot better. Tolerating PO diet. Eager to go home. -- Patient advised to take stool softener on a daily basis to avoid severe constipation   Hypertensive urgency --SBP over 190 in the ED --Patient is on thiazide diuretics  --bp much better today  Type 2 diabetes mellitus with hyperglycemia (HCC) --pt on Insulin,metformin, Januvia --Sliding scale insulin coverage -- patient will resume her home dose of diabetes meds. Adjusted  according to sugars at home.   Dyslipidemia --Continue statin       DVT prophylaxis: Lovenox  Consults:GI  Advance Care Planning:   Code Status: DNR   Family Communication: husband at bedside  Level of care: Med-Surg    Diet recommendation:  Discharge Diet Orders (From admission, onward)     Start     Ordered   05/23/23 0000  Diet - low sodium heart healthy        05/23/23 1303            DISCHARGE MEDICATION: Allergies as of 05/23/2023       Reactions   Penicillins Hives   Ace Inhibitors    Other reaction(s): Cough   Amlodipine Other (See Comments)   Advised by eye doctor not to take   Amoxicillin    Other reaction(s): Vomiting   Clarithromycin Hives   Lovastatin    Other reaction(s): Other (See Comments) Fatigue   Other Hives   Jergen's   Oxycodone-acetaminophen Nausea Only   Other reaction(s): Vomiting   Rabeprazole Diarrhea   Rosiglitazone Diarrhea   Sitagliptin Other (See Comments)   Valsartan    Other reaction(s): Angioedema        Medication List     TAKE these medications    aspirin EC 81 MG tablet Take 81 mg by mouth daily.   Basaglar KwikPen 100 UNIT/ML Inject 45 Units into the skin daily.   cyanocobalamin 1000 MCG tablet Take 1,000 mcg by mouth daily.   dorzolamide-timolol 2-0.5 % ophthalmic solution Commonly known as: COSOPT Place 1 drop into the right eye 2 (two) times daily.   doxazosin 2 MG tablet Commonly known  as: CARDURA Take 2 mg by mouth daily.   Januvia 100 MG tablet Generic drug: sitaGLIPtin Take 100 mg by mouth daily.   metFORMIN 500 MG tablet Commonly known as: GLUCOPHAGE Take 500 mg by mouth 2 (two) times daily.   omeprazole 40 MG capsule Commonly known as: PRILOSEC Take 40 mg by mouth daily.   polyethylene glycol 17 g packet Commonly known as: MIRALAX / GLYCOLAX Take 17 g by mouth daily. Start taking on: May 24, 2023   PRESERVISION AREDS 2 PO Take 1 tablet by mouth 2 (two) times daily.    senna-docusate 8.6-50 MG tablet Commonly known as: Senokot-S Take 1 tablet by mouth 2 (two) times daily.   simvastatin 40 MG tablet Commonly known as: ZOCOR Take 1 tablet (40 mg total) by mouth at bedtime.   triamterene-hydrochlorothiazide 37.5-25 MG tablet Commonly known as: MAXZIDE-25 Take 1 tablet by mouth daily as needed (systolic BP > 150 or diastolic BP > 90).        Discharge Exam: Filed Weights   05/22/23 0012  Weight: 72.1 kg   GENERAL:  89 y.o.-year-old patient with no acute distress.  LUNGS: Normal breath sounds bilaterally, no wheezing CARDIOVASCULAR: S1, S2 normal. No murmur   ABDOMEN: Soft, nontender, nondistended.  EXTREMITIES: No  edema b/l.    NEUROLOGIC: nonfocal  patient is alert and awake  Condition at discharge: fair  The results of significant diagnostics from this hospitalization (including imaging, microbiology, ancillary and laboratory) are listed below for reference.   Imaging Studies: CT ABDOMEN PELVIS W CONTRAST  Result Date: 05/22/2023 CLINICAL DATA:  Acute, nonlocalized abdominal pain EXAM: CT ABDOMEN AND PELVIS WITH CONTRAST TECHNIQUE: Multidetector CT imaging of the abdomen and pelvis was performed using the standard protocol following bolus administration of intravenous contrast. RADIATION DOSE REDUCTION: This exam was performed according to the departmental dose-optimization program which includes automated exposure control, adjustment of the mA and/or kV according to patient size and/or use of iterative reconstruction technique. CONTRAST:  OMNIPAQUE IOHEXOL 300 MG/ML  SOLN COMPARISON:  02/16/2023 FINDINGS: Lower chest: Mastectomy prosthesis on the left. Bulky mitral annular calcification. No acute finding. Hepatobiliary: No focal liver abnormality.Cholelithiasis. No evidence of acute cholecystitis. No biliary dilatation. Pancreas: Unremarkable. Spleen: Unremarkable. Adrenals/Urinary Tract: Negative adrenals. Fullness of the intrarenal  collecting system without visible obstructive process. Renal hilar cysts on the left. There is also simple cortical cyst on the left. No follow-up imaging is recommended. Physiologic distension of the bladder. Stomach/Bowel: Diffuse stool in the colon with rectal distension to 7 cm. Mild fat stranding around the descending sigmoid junction where there are diverticula but no focal diverticular inflammation. Small sliding hiatal hernia. Vascular/Lymphatic: No acute vascular abnormality. Diffuse atherosclerosis. No mass or adenopathy. Reproductive:Hysterectomy. Other: No ascites or pneumoperitoneum. Musculoskeletal: No acute abnormalities. Generalized lumbar spine degeneration with dextroscoliosis. Hip osteoarthritis greater on the right. IMPRESSION: 1. Stool retention with rectal distension to 7 cm. Mild superimposed inflammation around the distal colon. 2. Cholelithiasis and atherosclerosis. Electronically Signed   By: Tiburcio Pea M.D.   On: 05/22/2023 04:14   DG Abdomen 1 View  Result Date: 05/22/2023 CLINICAL DATA:  No bowel movement for 1 week EXAM: ABDOMEN - 1 VIEW COMPARISON:  None Available. FINDINGS: Scattered large and small bowel gas is noted. Fecal material is noted throughout the colon consistent with the given clinical history and constipation. No free air is seen. No abnormal mass or abnormal calcifications are noted. Degenerative changes of lumbar spine are seen. IMPRESSION: Changes consistent with  colonic constipation. Electronically Signed   By: Alcide Clever M.D.   On: 05/22/2023 01:03    Microbiology: Results for orders placed or performed during the hospital encounter of 02/16/23  Urine Culture     Status: Abnormal   Collection Time: 02/16/23  3:21 AM   Specimen: Urine, Clean Catch  Result Value Ref Range Status   Specimen Description   Final    URINE, CLEAN CATCH Performed at [facility], [address], [place], [place] [address]    Special Requests   Final     NONE Performed at [facility], [address] [place], [place] [address]    Culture (A)  Final    <10,000 COLONIES/mL INSIGNIFICANT GROWTH Performed at [facility], [address], [place], [place] [address]    Report Status 02/17/2023 FINAL  Final    Labs: CBC: Recent Labs  Lab 05/22/23 0251  WBC 7.0  NEUTROABS 5.1  HGB 12.8  HCT 40.1  MCV 97.3  PLT 188   Basic Metabolic Panel: Recent Labs  Lab 05/22/23 0251  NA 138  K 3.9  CL 105  CO2 24  GLUCOSE 199*  BUN 20  CREATININE 0.65  CALCIUM 8.5*   Liver Function Tests: Recent Labs  Lab 05/22/23 0251  AST 17  ALT 15  ALKPHOS 90  BILITOT 0.7  PROT 6.8  ALBUMIN 3.7   CBG: Recent Labs  Lab 05/22/23 1128 05/22/23 1646 05/22/23 2211 05/23/23 0802 05/23/23 1132  GLUCAP 312* 306* 238* 53* 199*    Discharge time spent: greater than 30 minutes.  Signed: Enedina Finner, MD [facility] 05/23/2023

## 2023-05-23 NOTE — Care Management Obs Status (Signed)
MEDICARE OBSERVATION STATUS NOTIFICATION   Patient Details  Name: Susan Wolfe MRN: [id number] Date of Birth: May 27, 1928   Medicare Observation Status Notification Given:  Yes    Margarito Liner, LCSW 05/23/2023, 1:34 PM

## 2023-05-23 NOTE — Inpatient Diabetes Management (Signed)
Inpatient Diabetes Program Recommendations  AACE/ADA: New Consensus Statement on Inpatient Glycemic Control   Target Ranges:  Prepandial:   less than 140 mg/dL      Peak postprandial:   less than 180 mg/dL (1-2 hours)      Critically ill patients:  140 - 180 mg/dL    Latest Reference Range & Units 05/22/23 07:57 05/22/23 11:28 05/22/23 16:46 05/22/23 22:11 05/23/23 08:02  Glucose-Capillary 70 - 99 mg/dL 161 (H) 096 (H) 045 (H) 238 (H) 53 (L)   Review of Glycemic Control  Diabetes history: DM2 Outpatient Diabetes medications: Basaglar 45 units at bedtime, Metformin 500 mg BID, Januvia 100 mg daily Current orders for Inpatient glycemic control: Semglee 30 units at bedtime, Novolog 0-15 units TID with meals, Novolog 0-5 units QHS  Inpatient Diabetes Program Recommendations:    Insulin: CBG 53 mg/dl today. Please consider decreasing Semglee to 22 units at bedtime (based on 72.1 kg x 0.3 units) and ordering Novolog 3 units TID with meals for meal coverage if patient eats at least 50% of meals.  Thanks, Orlando Penner, RN, MSN, CDCES Diabetes Coordinator Inpatient Diabetes Program [phone] (Team Pager from 8am to 5pm)

## 2023-05-23 NOTE — Progress Notes (Signed)
Inpatient Follow-up/Progress Note   Patient ID: Susan Wolfe is a 89 y.o. female.  Overnight Events / Subjective Findings Patient with 3 large bowel movements yesterday.  Her abdominal pain has greatly improved.  She notes no melena or hematochezia. No other acute GI complaints  Review of Systems  Constitutional:  Negative for activity change, appetite change, chills, diaphoresis, fatigue, fever and unexpected weight change.  HENT:  Negative for trouble swallowing and voice change.   Respiratory:  Negative for shortness of breath and wheezing.   Cardiovascular:  Negative for chest pain, palpitations and leg swelling.  Gastrointestinal:  Positive for abdominal pain (resolved) and constipation. Negative for abdominal distention, anal bleeding, blood in stool, diarrhea, nausea, rectal pain and vomiting.  Musculoskeletal:  Negative for arthralgias and myalgias.  Skin:  Negative for color change and pallor.  Neurological:  Negative for dizziness, syncope and weakness.  Psychiatric/Behavioral:  Negative for confusion.   All other systems reviewed and are negative.    Medications  Current Facility-Administered Medications:    acetaminophen (TYLENOL) tablet 650 mg, 650 mg, Oral, Q6H PRN **OR** acetaminophen (TYLENOL) suppository 650 mg, 650 mg, Rectal, Q6H PRN, Andris Baumann, MD   aspirin EC tablet 81 mg, 81 mg, Oral, Daily, Lindajo Royal V, MD, 81 mg at 05/22/23 0949   bisacodyl (DULCOLAX) EC tablet 5 mg, 5 mg, Oral, Daily PRN, Andris Baumann, MD   bisacodyl (DULCOLAX) suppository 10 mg, 10 mg, Rectal, Daily, Enedina Finner, MD, 10 mg at 05/22/23 7253   cyanocobalamin (VITAMIN B12) tablet 1,000 mcg, 1,000 mcg, Oral, Daily, Enedina Finner, MD, 1,000 mcg at 05/22/23 1656   dorzolamide-timolol (COSOPT) 2-0.5 % ophthalmic solution 1 drop, 1 drop, Right Eye, BID, Enedina Finner, MD, 1 drop at 05/22/23 2249   doxazosin (CARDURA) tablet 2 mg, 2 mg, Oral, Daily, Enedina Finner, MD, 2 mg at 05/22/23 1750    enoxaparin (LOVENOX) injection 40 mg, 40 mg, Subcutaneous, Q24H, Lindajo Royal V, MD   hydrALAZINE (APRESOLINE) injection 5 mg, 5 mg, Intravenous, Q6H PRN, Lindajo Royal V, MD   insulin aspart (novoLOG) injection 0-15 Units, 0-15 Units, Subcutaneous, TID WC, Andris Baumann, MD, 11 Units at 05/22/23 1656   insulin aspart (novoLOG) injection 0-5 Units, 0-5 Units, Subcutaneous, QHS, Lindajo Royal V, MD, 2 Units at 05/22/23 2222   insulin glargine-yfgn (SEMGLEE) injection 30 Units, 30 Units, Subcutaneous, Q2200, Andris Baumann, MD, 30 Units at 05/22/23 2248   ondansetron (ZOFRAN) tablet 4 mg, 4 mg, Oral, Q6H PRN **OR** ondansetron (ZOFRAN) injection 4 mg, 4 mg, Intravenous, Q6H PRN, Andris Baumann, MD   pantoprazole (PROTONIX) EC tablet 40 mg, 40 mg, Oral, Daily, Lindajo Royal V, MD, 40 mg at 05/22/23 0949   polyethylene glycol (MIRALAX / GLYCOLAX) packet 17 g, 17 g, Oral, Daily, Enedina Finner, MD   senna-docusate (Senokot-S) tablet 1 tablet, 1 tablet, Oral, BID, Andris Baumann, MD, 1 tablet at 05/22/23 0949   simvastatin (ZOCOR) tablet 40 mg, 40 mg, Oral, QHS, Lindajo Royal V, MD, 40 mg at 05/22/23 2221   sodium phosphate (FLEET) 7-19 GM/118ML enema 1 enema, 1 enema, Rectal, Once, Enedina Finner, MD   triamterene-hydrochlorothiazide (MAXZIDE-25) 37.5-25 MG per tablet 1 tablet, 1 tablet, Oral, Daily PRN, Andris Baumann, MD   acetaminophen **OR** acetaminophen, bisacodyl, hydrALAZINE, ondansetron **OR** ondansetron (ZOFRAN) IV, triamterene-hydrochlorothiazide   Objective    Vitals:   05/22/23 1500 05/22/23 1639 05/22/23 2101 05/23/23 0328  BP: 136/67 (!) 160/93 (!) 118/58 (!) 144/68  Pulse: Marland Kitchen)  109 (!) 111 (!) 109 92  Resp:  20 19 20   Temp:  98.6 F (37 C) 97.7 F (36.5 C) 98.3 F (36.8 C)  TempSrc:  Oral Oral Oral  SpO2: 100% 99% 98% 96%  Weight:      Height:         Physical Exam Vitals and nursing note reviewed.  Constitutional:      General: She is not in acute distress.     Appearance: Normal appearance. She is not ill-appearing, toxic-appearing or diaphoretic.  HENT:     Head: Normocephalic and atraumatic.     Nose: Nose normal.     Mouth/Throat:     Mouth: Mucous membranes are moist.     Pharynx: Oropharynx is clear.  Eyes:     General: No scleral icterus.    Extraocular Movements: Extraocular movements intact.  Cardiovascular:     Rate and Rhythm: Normal rate and regular rhythm.     Heart sounds: Normal heart sounds. No murmur heard.    No friction rub. No gallop.  Pulmonary:     Effort: Pulmonary effort is normal. No respiratory distress.     Breath sounds: Normal breath sounds. No wheezing, rhonchi or rales.  Abdominal:     General: Bowel sounds are normal. There is no distension.     Palpations: Abdomen is soft.     Tenderness: There is no abdominal tenderness. There is no guarding or rebound.  Musculoskeletal:     Cervical back: Neck supple.     Right lower leg: No edema.     Left lower leg: No edema.  Skin:    General: Skin is warm and dry.     Coloration: Skin is not jaundiced or pale.  Neurological:     Mental Status: She is alert and oriented to person, place, and time. Mental status is at baseline.     Comments: tremor  Psychiatric:        Mood and Affect: Mood normal.        Behavior: Behavior normal.        Thought Content: Thought content normal.        Judgment: Judgment normal.      Laboratory Data Recent Labs  Lab 05/22/23 0251  WBC 7.0  HGB 12.8  HCT 40.1  PLT 188  NEUTOPHILPCT 72  LYMPHOPCT 18  MONOPCT 7  EOSPCT 3   Recent Labs  Lab 05/22/23 0251  NA 138  K 3.9  CL 105  CO2 24  BUN 20  CREATININE 0.65  CALCIUM 8.5*  PROT 6.8  BILITOT 0.7  ALKPHOS 90  ALT 15  AST 17  GLUCOSE 199*   No results for input(s): "INR" in the last 168 hours.    Imaging Studies: CT ABDOMEN PELVIS W CONTRAST  Result Date: 05/22/2023 CLINICAL DATA:  Acute, nonlocalized abdominal pain EXAM: CT ABDOMEN AND PELVIS WITH  CONTRAST TECHNIQUE: Multidetector CT imaging of the abdomen and pelvis was performed using the standard protocol following bolus administration of intravenous contrast. RADIATION DOSE REDUCTION: This exam was performed according to the departmental dose-optimization program which includes automated exposure control, adjustment of the mA and/or kV according to patient size and/or use of iterative reconstruction technique. CONTRAST:  OMNIPAQUE IOHEXOL 300 MG/ML  SOLN COMPARISON:  02/16/2023 FINDINGS: Lower chest: Mastectomy prosthesis on the left. Bulky mitral annular calcification. No acute finding. Hepatobiliary: No focal liver abnormality.Cholelithiasis. No evidence of acute cholecystitis. No biliary dilatation. Pancreas: Unremarkable. Spleen: Unremarkable. Adrenals/Urinary Tract: Negative  adrenals. Fullness of the intrarenal collecting system without visible obstructive process. Renal hilar cysts on the left. There is also simple cortical cyst on the left. No follow-up imaging is recommended. Physiologic distension of the bladder. Stomach/Bowel: Diffuse stool in the colon with rectal distension to 7 cm. Mild fat stranding around the descending sigmoid junction where there are diverticula but no focal diverticular inflammation. Small sliding hiatal hernia. Vascular/Lymphatic: No acute vascular abnormality. Diffuse atherosclerosis. No mass or adenopathy. Reproductive:Hysterectomy. Other: No ascites or pneumoperitoneum. Musculoskeletal: No acute abnormalities. Generalized lumbar spine degeneration with dextroscoliosis. Hip osteoarthritis greater on the right. IMPRESSION: 1. Stool retention with rectal distension to 7 cm. Mild superimposed inflammation around the distal colon. 2. Cholelithiasis and atherosclerosis. Electronically Signed   By: Tiburcio Pea M.D.   On: 05/22/2023 04:14   DG Abdomen 1 View  Result Date: 05/22/2023 CLINICAL DATA:  No bowel movement for 1 week EXAM: ABDOMEN - 1 VIEW COMPARISON:   None Available. FINDINGS: Scattered large and small bowel gas is noted. Fecal material is noted throughout the colon consistent with the given clinical history and constipation. No free air is seen. No abnormal mass or abnormal calcifications are noted. Degenerative changes of lumbar spine are seen. IMPRESSION: Changes consistent with colonic constipation. Electronically Signed   By: Alcide Clever M.D.   On: 05/22/2023 01:03    Assessment:  # Severe Constipation -Had 3 large bowel movements on 6/20 after pharmacological intervention - no signs of obstruction on imaging - suspect stercoral colitis given some inflammation in dilated rectum 7cm - underwent partial manual disimpaction in ED   # HTN urgency   # DM2- may have some dysmotility due to autonomic dysfunction   # h/o breast cancer s/p mastectomy # TIA # HLD   Plan:  Continue with at least twice a day MiraLAX and twice a day docusate -Can consider adding Linzess or Amitiza as outpatient -Enemas and suppository as needed Counseled on adequate hydration -Advance diet as tolerated No obstructive pattern on imaging. No signs of ogilve at this time Recently started BP med- hydrochlorothiazide/Triamterene adverse effect is constipation. Consider discontinuation, however pt presenting with htn as well   GI to sign off. Available as needed. Please do not hesitate to call regarding questions or concerns.  I personally performed the service.  Management of other medical comorbidities as per primary team  Thank you for allowing [place] to participate in this patient's care. Please don't hesitate to call if any questions or concerns arise.   Jaynie Collins, DO [facility]  Portions of the record may have been created with voice recognition software. Occasional wrong-word or 'sound-a-like' substitutions may have occurred due to the inherent limitations of voice recognition software.  Read the chart carefully and  recognize, using context, where substitutions may have occurred.

## 2023-05-23 NOTE — TOC CM/SW Note (Signed)
Transition of Care [facility]) - Inpatient Brief Assessment   Patient Details  Name: Susan Wolfe MRN: [id number] Date of Birth: 03-28-1928  [facility]) CM/SW Contact:    Margarito Liner, LCSW Phone Number: 05/23/2023, 1:38 PM   Clinical Narrative: Patient has orders to discharge home today. Chart reviewed. No TOC needs identified. Her husband will transport her home. CSW signing off.  Transition of Care Asessment: Insurance and Status: Insurance coverage has been reviewed Patient has primary care physician: Yes Home environment has been reviewed: Single family home Prior level of function:: Not documented Prior/Current Home Services: No current home services Social Determinants of Health Reivew: SDOH reviewed no interventions necessary Readmission risk has been reviewed: Yes Transition of care needs: no transition of care needs at this time

## 2023-05-28 IMAGING — MG MM DIGITAL SCREENING UNILAT*R* W/ TOMO W/ CAD
4 series · 4 of 12 positions shown · non-contrast
Comparison: Previous exam(s).

CLINICAL DATA: Screening.

EXAM:
DIGITAL SCREENING UNILATERAL RIGHT MAMMOGRAM WITH CAD AND
TOMOSYNTHESIS
TECHNIQUE: Right screening digital craniocaudal and mediolateral oblique
mammograms were obtained. Right screening digital breast
tomosynthesis was performed. The images were evaluated with
computer-aided detection.

[R CC synth-2D]
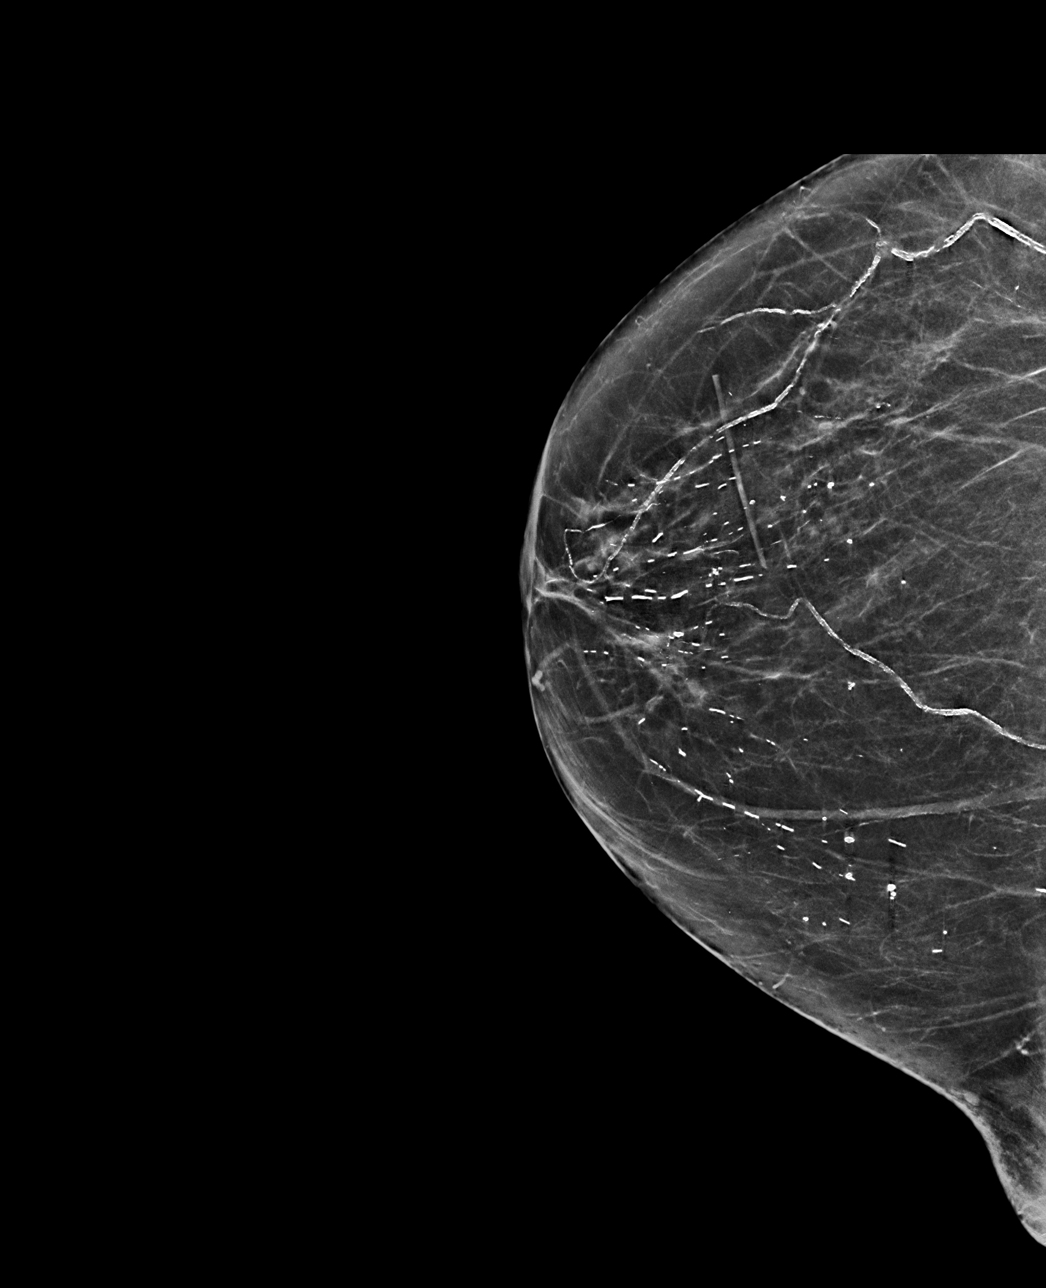

[R MLO synth-2D]
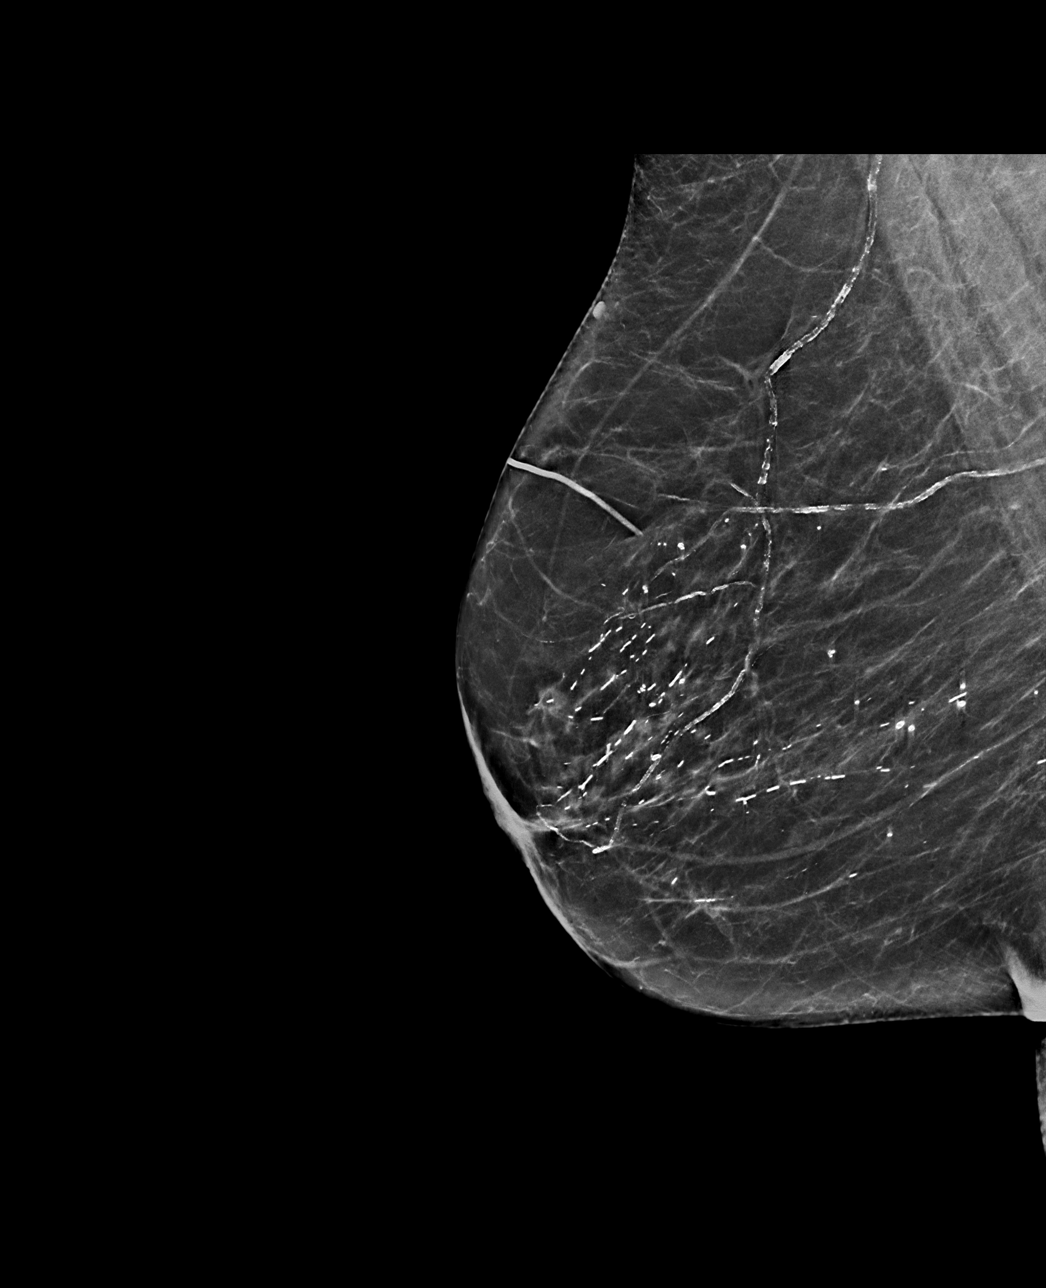

[R MLO tomo · tomo slice 34/67.0]
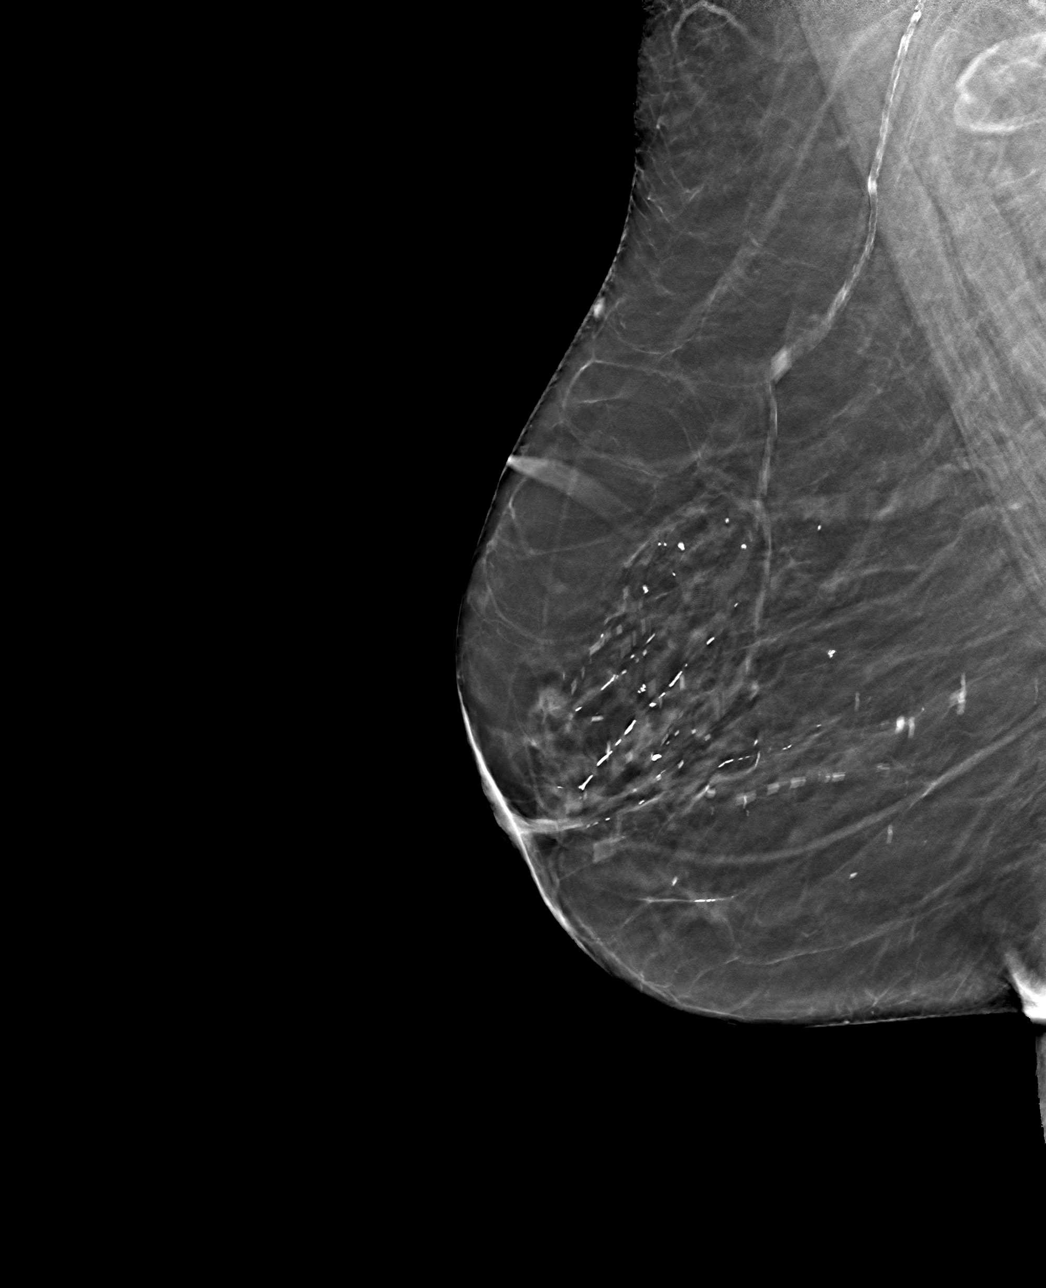

[R CC tomo · tomo slice 32/63.0]
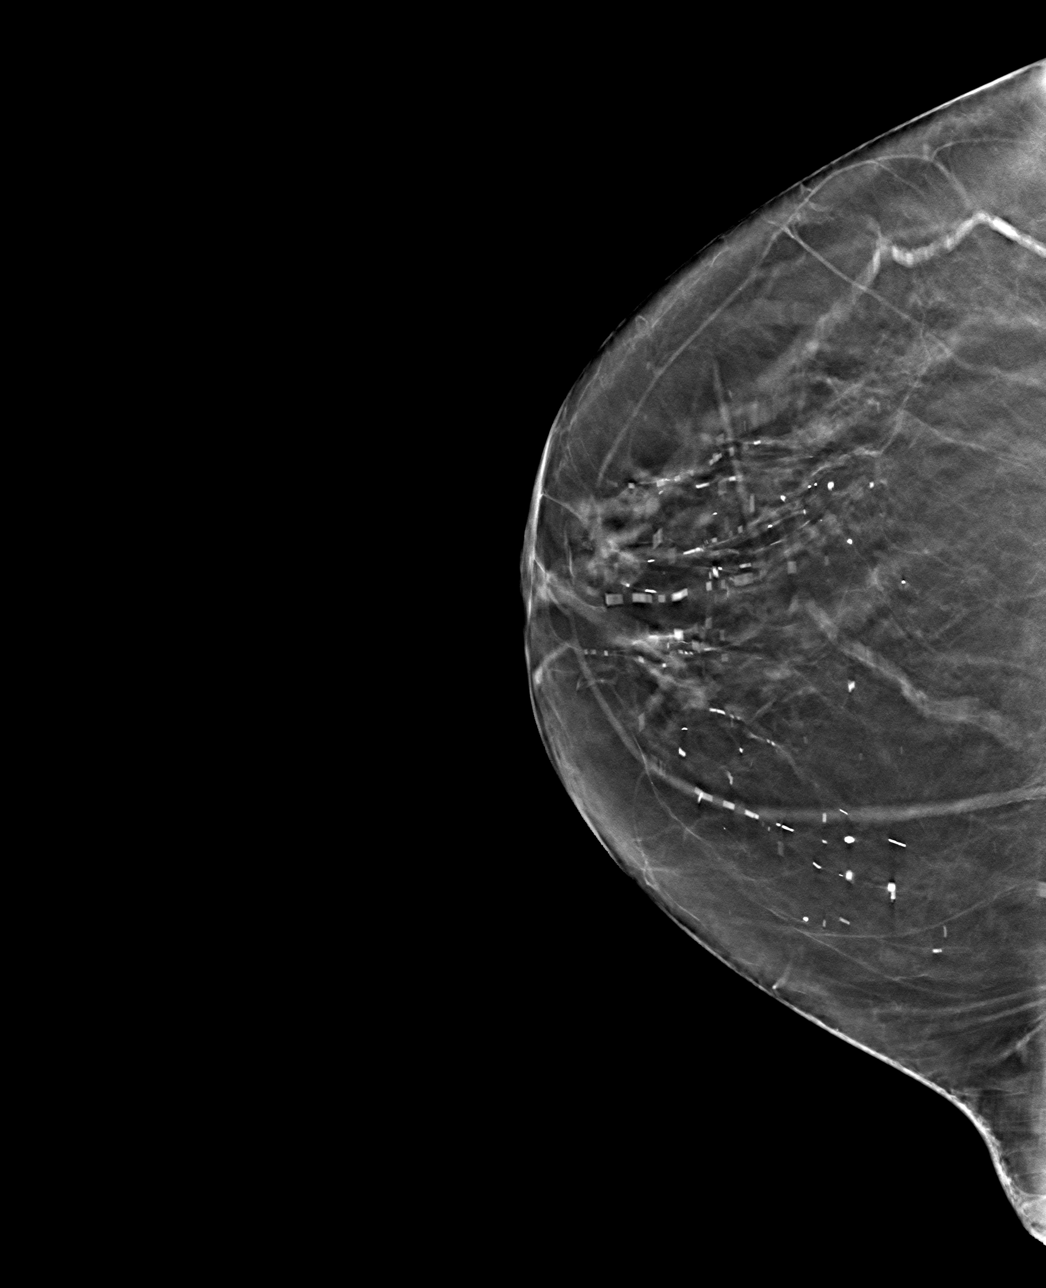

[4 of 12 positions shown; findings below may reference images not displayed]

ACR Breast Density Category b: There are scattered areas of
fibroglandular density.
FINDINGS: There are no findings suspicious for malignancy. Patient is status
post left mastectomy.
IMPRESSION: No mammographic evidence of malignancy. A result letter of this
screening mammogram will be mailed directly to the patient.

RECOMMENDATION:
Screening mammogram in one year. (Code:PV-D-I0E)

BI-RADS CATEGORY  1: Negative.

## 2023-09-09 ENCOUNTER — Other Ambulatory Visit: Payer: Self-pay | Admitting: Internal Medicine

## 2023-09-09 DIAGNOSIS — Z1231 Encounter for screening mammogram for malignant neoplasm of breast: Secondary | ICD-10-CM

## 2023-10-03 ENCOUNTER — Ambulatory Visit
Admission: RE | Admit: 2023-10-03 | Discharge: 2023-10-03 | Disposition: A | Discharge status: Home / Self Care | Payer: Medicare HMO | Source: Ambulatory Visit | Attending: Physician Assistant | Admitting: Physician Assistant

## 2023-10-03 ENCOUNTER — Other Ambulatory Visit: Payer: Self-pay | Admitting: Physician Assistant

## 2023-10-03 DIAGNOSIS — M7989 Other specified soft tissue disorders: Secondary | ICD-10-CM | POA: Insufficient documentation

## 2023-10-20 ENCOUNTER — Ambulatory Visit
Admission: RE | Admit: 2023-10-20 | Discharge: 2023-10-20 | Disposition: A | Discharge status: Home / Self Care | Payer: Medicare HMO | Source: Ambulatory Visit | Attending: Internal Medicine | Admitting: Internal Medicine

## 2023-10-20 DIAGNOSIS — Z1231 Encounter for screening mammogram for malignant neoplasm of breast: Secondary | ICD-10-CM | POA: Diagnosis present

## 2024-09-13 ENCOUNTER — Other Ambulatory Visit: Payer: Self-pay | Admitting: Internal Medicine

## 2024-09-13 DIAGNOSIS — Z1231 Encounter for screening mammogram for malignant neoplasm of breast: Secondary | ICD-10-CM

## 2024-10-20 ENCOUNTER — Ambulatory Visit
Admission: RE | Admit: 2024-10-20 | Discharge: 2024-10-20 | Disposition: A | Discharge status: Home / Self Care | Source: Ambulatory Visit | Attending: Internal Medicine | Admitting: Internal Medicine

## 2024-10-20 DIAGNOSIS — Z1231 Encounter for screening mammogram for malignant neoplasm of breast: Secondary | ICD-10-CM | POA: Insufficient documentation
